# Patient Record
Sex: Female | Born: 1975 | Race: White | Hispanic: No | Marital: Married | Smoking: Current every day smoker
Health system: Southern US, Community
[De-identification: ages and names within clinical notes are randomized; demographics above are authoritative.]

## PROBLEM LIST (undated history)

## (undated) DIAGNOSIS — N63 Unspecified lump in unspecified breast: Secondary | ICD-10-CM

## (undated) DIAGNOSIS — F32A Depression, unspecified: Secondary | ICD-10-CM

## (undated) DIAGNOSIS — F329 Major depressive disorder, single episode, unspecified: Secondary | ICD-10-CM

## (undated) HISTORY — DX: Major depressive disorder, single episode, unspecified: F32.9

## (undated) HISTORY — DX: Depression, unspecified: F32.A

## (undated) HISTORY — DX: Unspecified lump in unspecified breast: N63.0

---

## 2004-05-22 HISTORY — PX: MAXILLARY CYST EXCISION: SHX2004

## 2005-08-28 ENCOUNTER — Emergency Department: Payer: Self-pay | Admitting: Emergency Medicine

## 2005-08-31 ENCOUNTER — Emergency Department: Payer: Self-pay | Admitting: Emergency Medicine

## 2006-01-17 ENCOUNTER — Ambulatory Visit: Payer: Self-pay | Admitting: *Deleted

## 2008-01-28 IMAGING — CT CT STONE STUDY
1 of 2 series · 15 of 32 positions shown, 19 images · non-contrast
Comparison: none

REASON FOR EXAM: abdominal pain       rm 4
COMMENTS:

[Series 2: stone · axial · 0.59mm/px · z∈[-536,-188]mm · 15 of 130 slices shown, 19 images]
[im 9/130  soft-tissue]
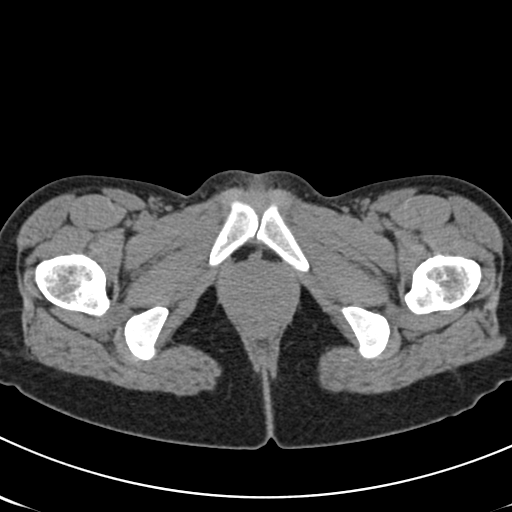
[im 9/130  bone]
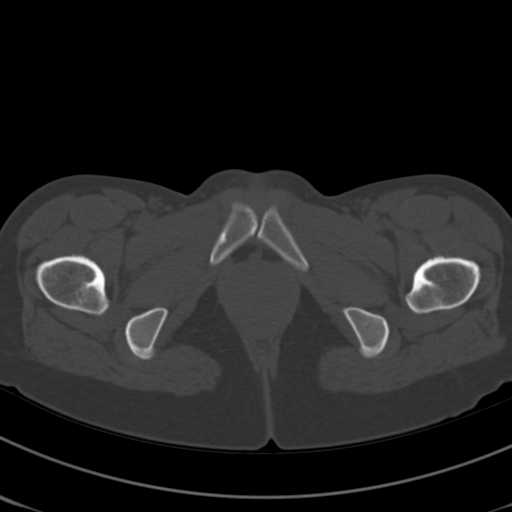
[im 18/130  soft-tissue]
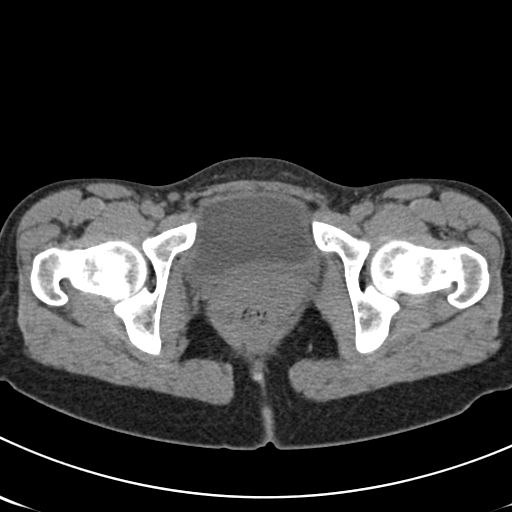
[im 27/130  soft-tissue]
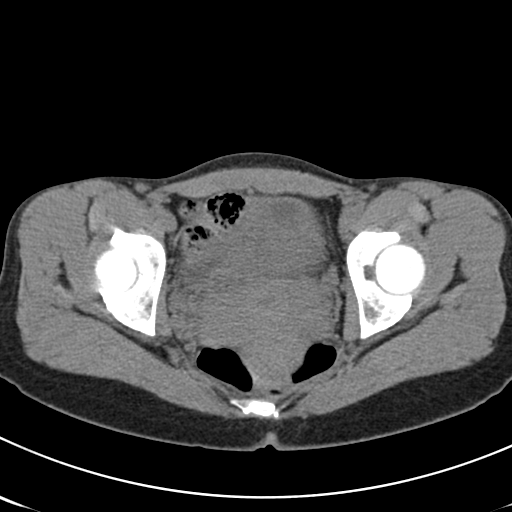
[im 36/130  soft-tissue]
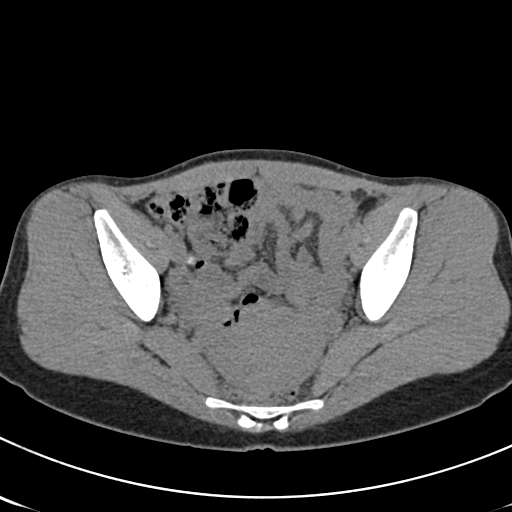
[im 45/130  soft-tissue]
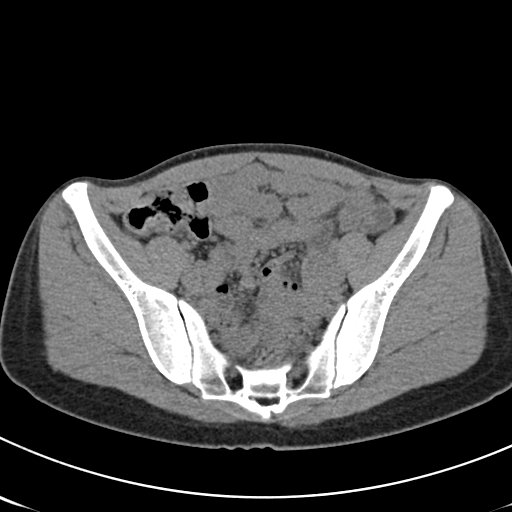
[im 54/130  soft-tissue]
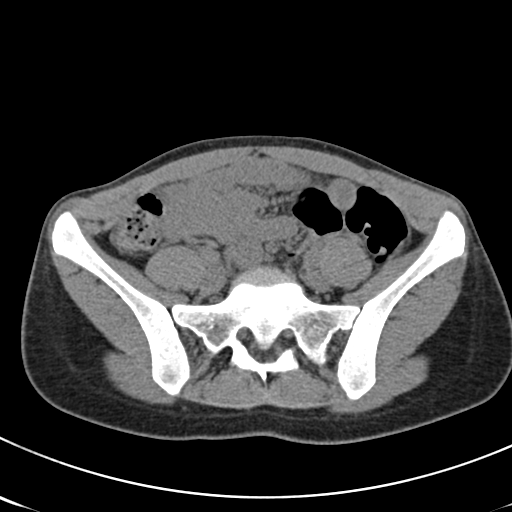
[im 67/130  soft-tissue]
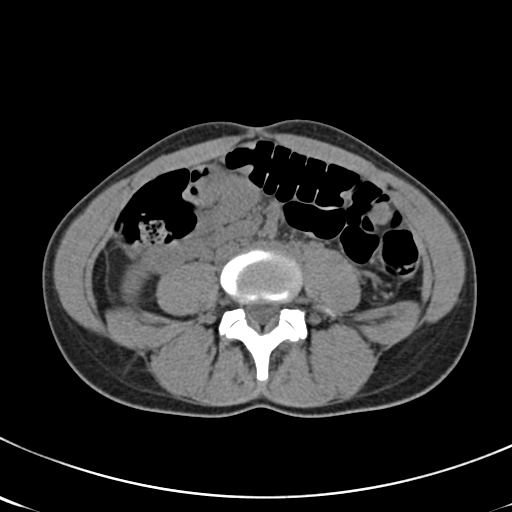
[im 76/130  soft-tissue]
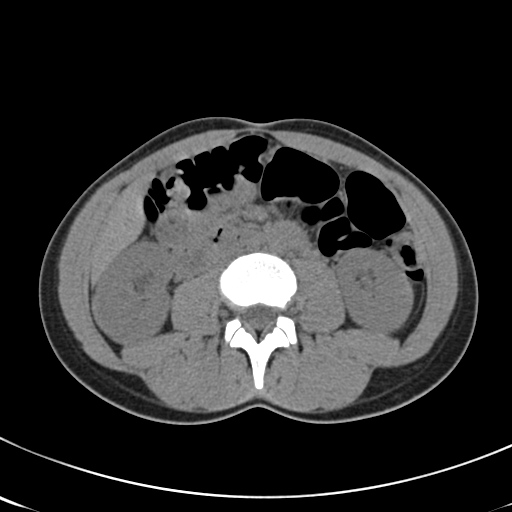
[im 85/130  soft-tissue]
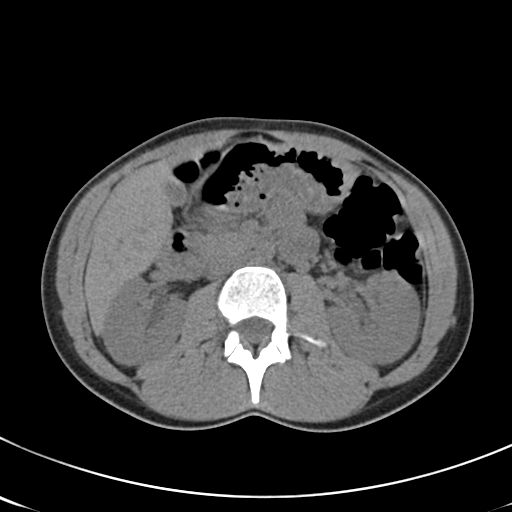
[im 85/130  bone]
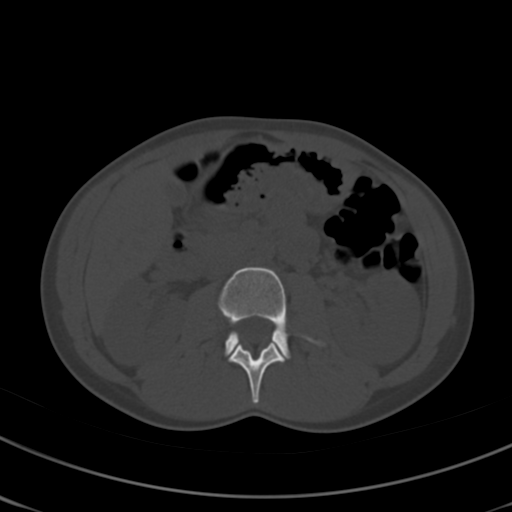
[im 94/130  soft-tissue]
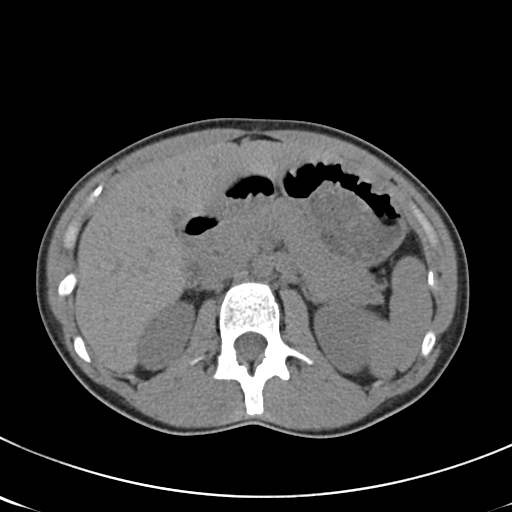
[im 103/130  soft-tissue]
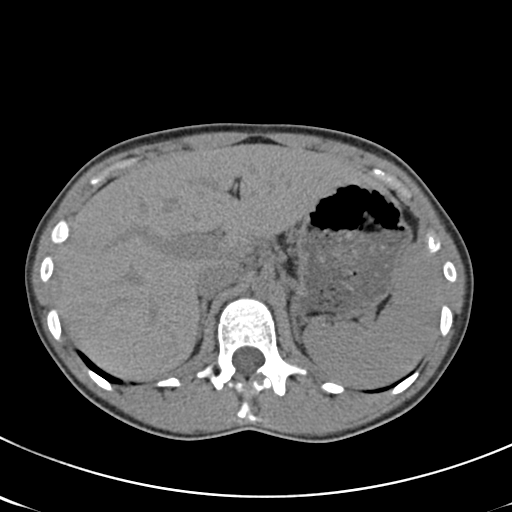
[im 112/130  soft-tissue]
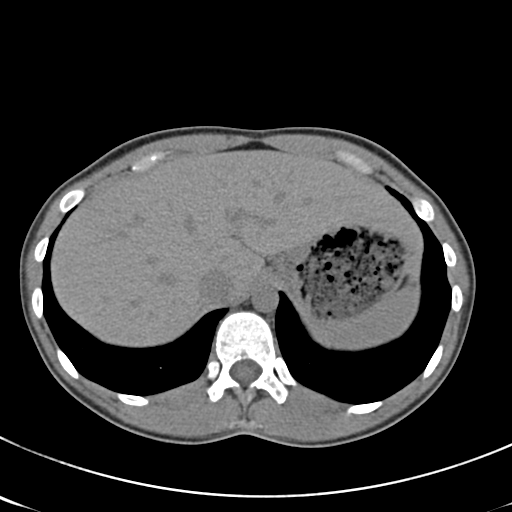
[im 112/130  lung]
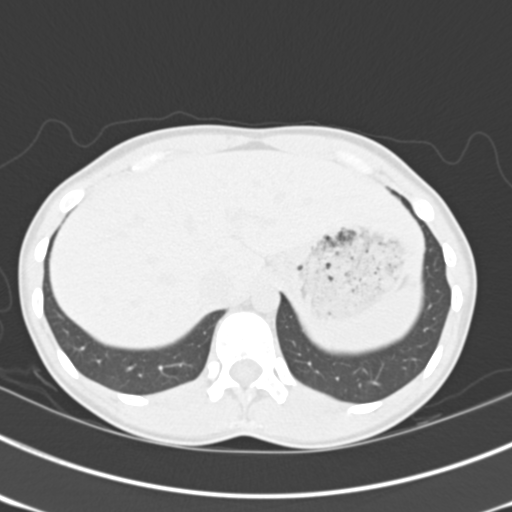
[im 116/130  lung]
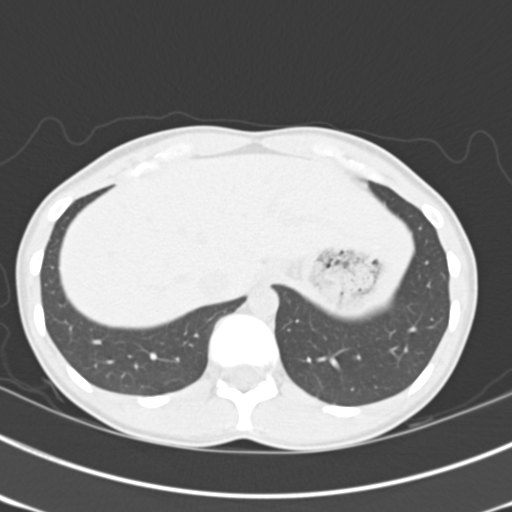
[im 121/130  soft-tissue]
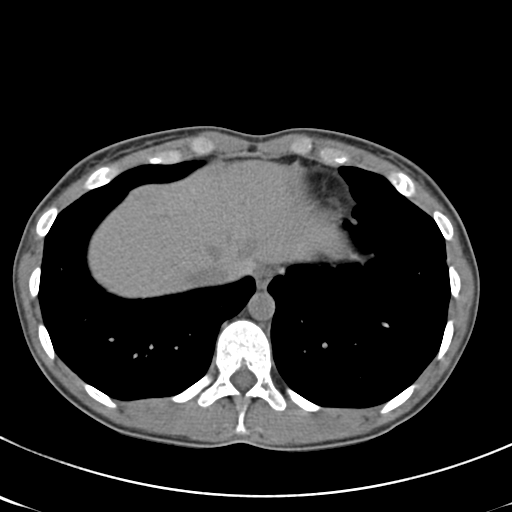
[im 121/130  lung]
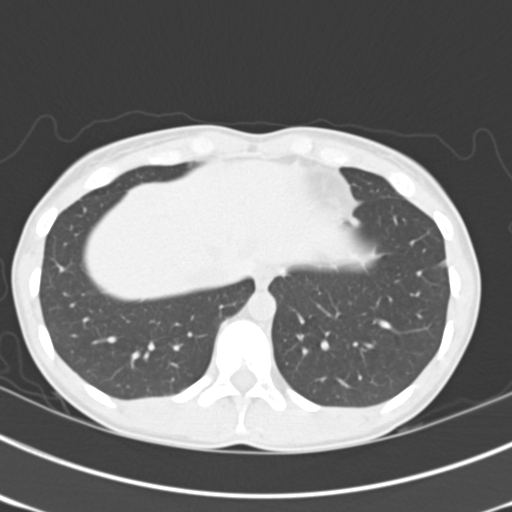
[im 125/130  lung]
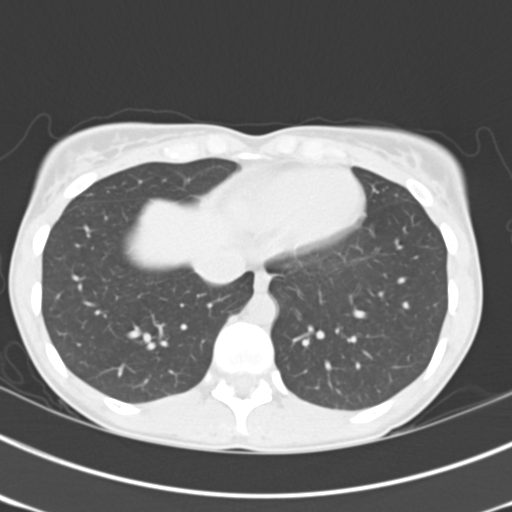

[15 of 32 positions shown; findings below may reference images not displayed]

PROCEDURE:     CT  - CT ABDOMEN /PELVIS WO (STONE)  - August 28, 2005  [DATE]

RESULT:     A preliminary report was faxed to Dr. Philomena Anand of the
Emergency Department on 08-28-05 at [DATE] Eastern Standard Time.

Evaluation of the lung bases demonstrates no focal abnormalities.
Non-contrast evaluation of the RIGHT and LEFT kidneys demonstrates no
evidence of calculi. There is mild pelvocaliectasis involving the RIGHT
kidney without evidence of hydronephrosis.  No gross evidence of masses are
identified. There is no evidence of hydroureter nor ureterolithiasis.
Evaluation of the LEFT kidney demonstrates no evidence of hydronephrosis,
masses nor calculi. There is mild prominence of the LEFT renal pelvis. The
remaining solid abdominal viscera demonstrate no radiographic abnormalities.
There is no evidence of abdominal or pelvic masses, free fluid, nor
drainable loculated fluid collections. Incidental note is made of fluid
within the endometrial canal and correlation with the patient's menstrual
cycle is recommended.
IMPRESSION: 1)No evidence of hydronephrosis nor hydroureter nor ureterolithiasis nor
nephrolithiasis.

2)Small amount of fluid within the endometrial canal.  Correlation with the
patient's menstrual cycle is recommended. There does not appear to be CT
evidence to suggest the sequela of appendicitis.

## 2008-04-02 ENCOUNTER — Emergency Department: Payer: Self-pay | Admitting: Unknown Physician Specialty

## 2008-06-18 IMAGING — CT CT PARANASAL SINUSES W/O CM
1 series · 14 of 16 positions shown, 18 images · non-contrast
Comparison: none

REASON FOR EXAM: Left facial swelling, palpable mass over left maxillary
sinus
COMMENTS:

PROCEDURE:     CT  - CT SINUSES WITHOUT CONTRAST  - January 17, 2006  [DATE]
RESULT:     CT scan of sinuses is performed without contrast.

[Series 2: sinus · axial · 0.27mm/px · z∈[+1110,+1170]mm · 14 of 16 slices shown, 18 images]
[im 2/16  brain]
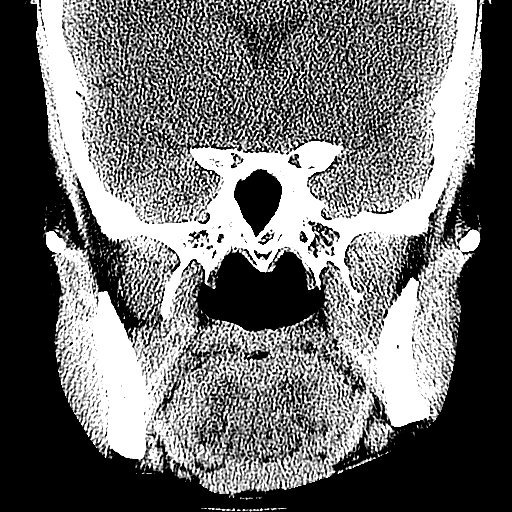
[im 2/16  bone]
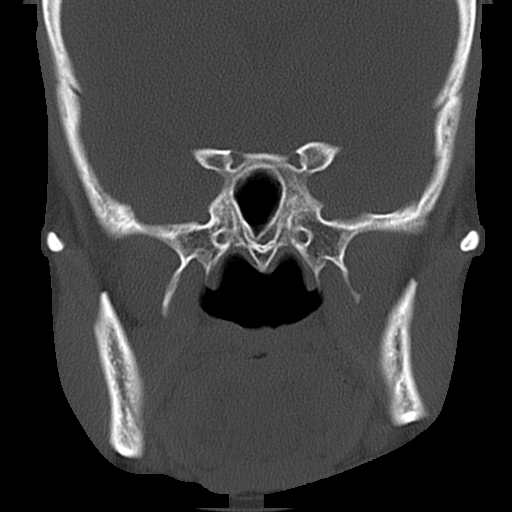
[im 3/16  bone]
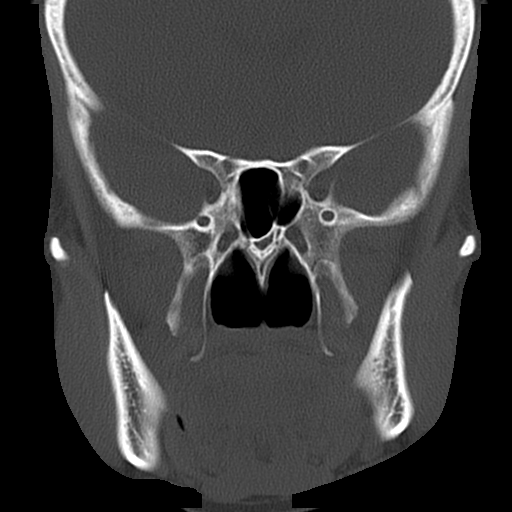
[im 4/16  bone]
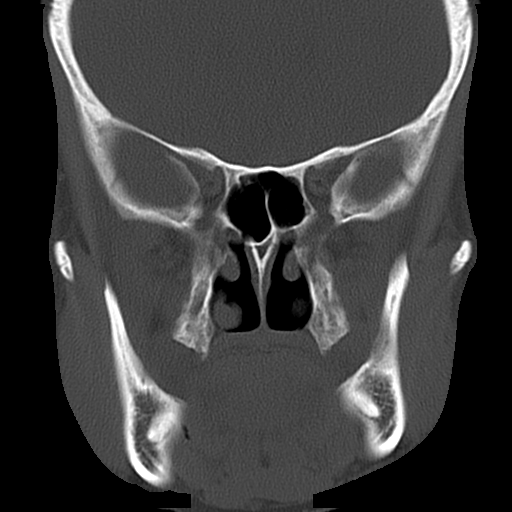
[im 5/16  bone]
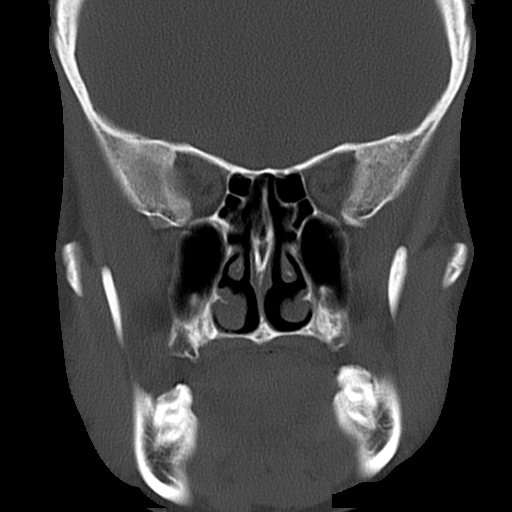
[im 6/16  brain]
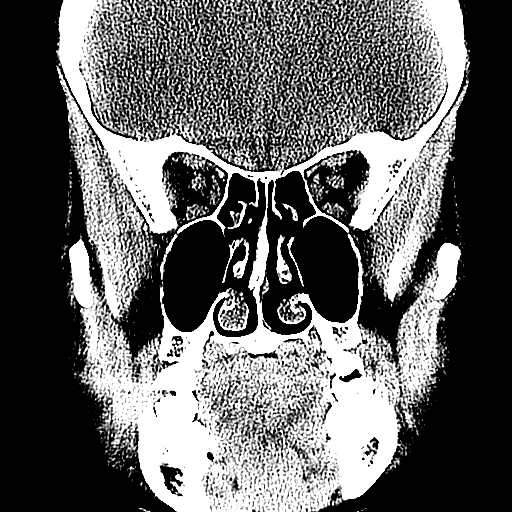
[im 6/16  bone]
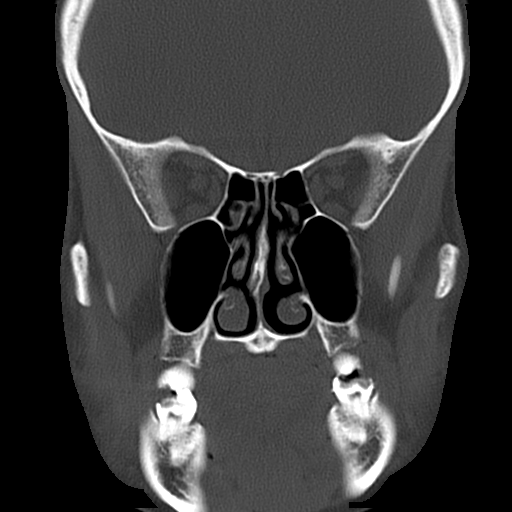
[im 7/16  bone]
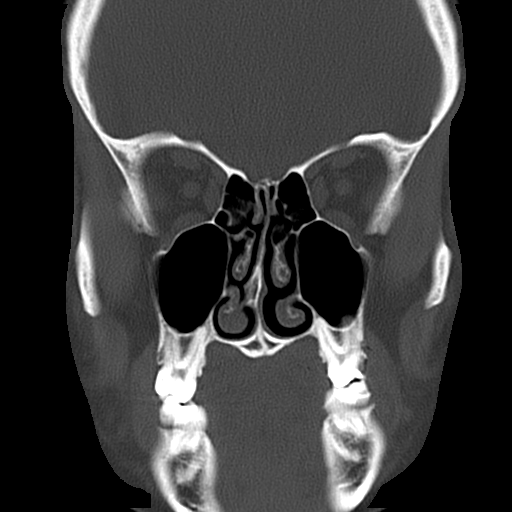
[im 8/16  bone]
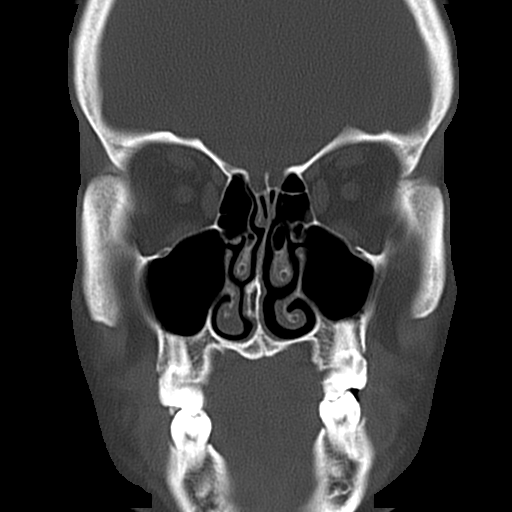
[im 9/16  bone]
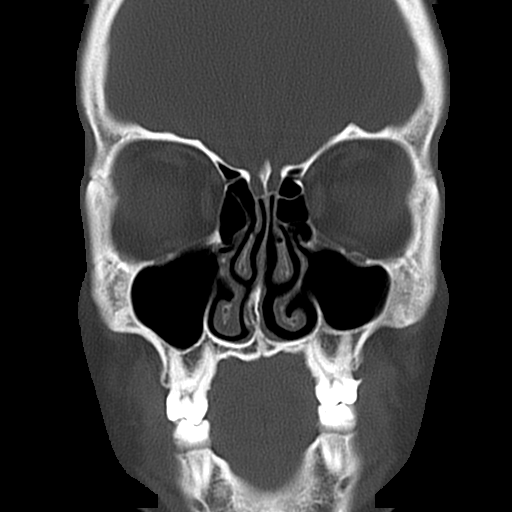
[im 10/16  brain]
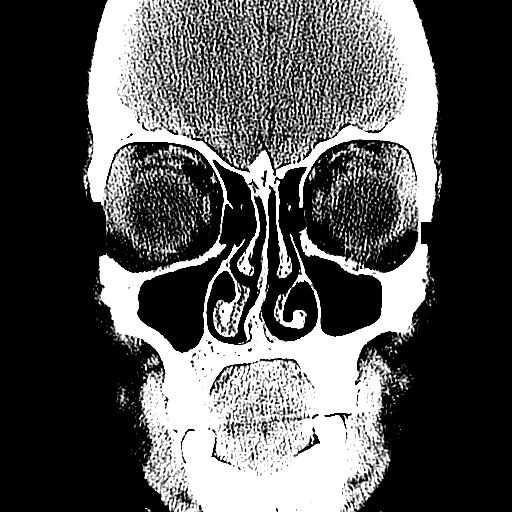
[im 10/16  bone]
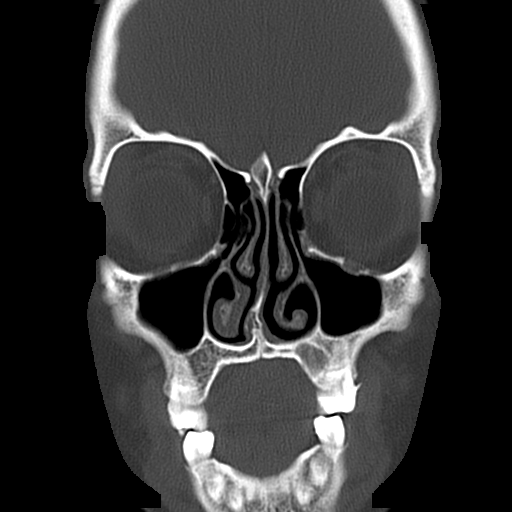
[im 11/16  bone]
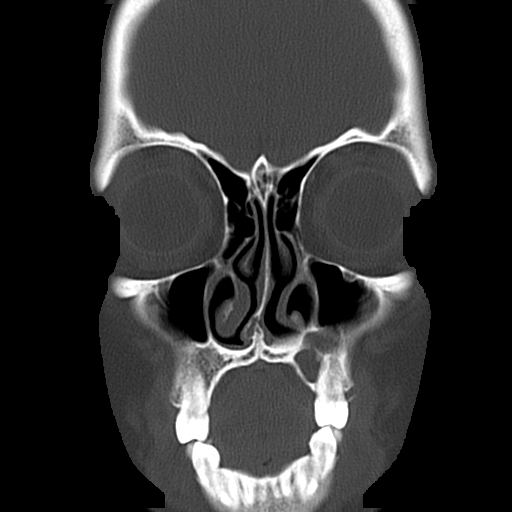
[im 12/16  bone]
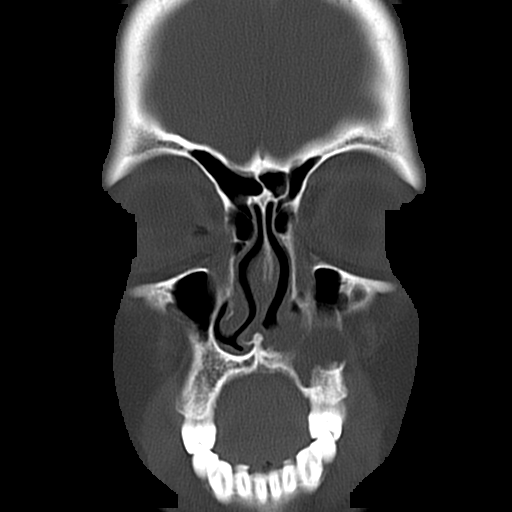
[im 13/16  bone]
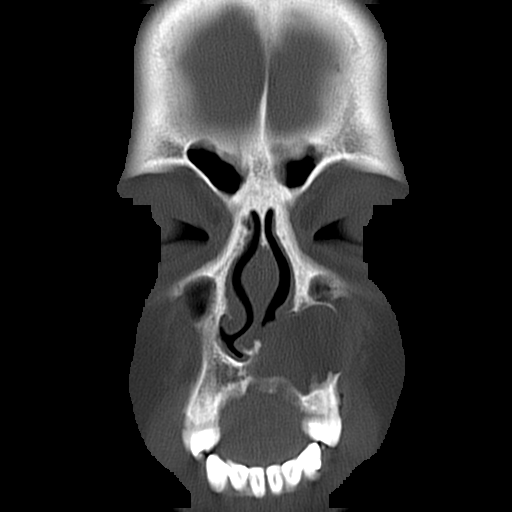
[im 14/16  brain]
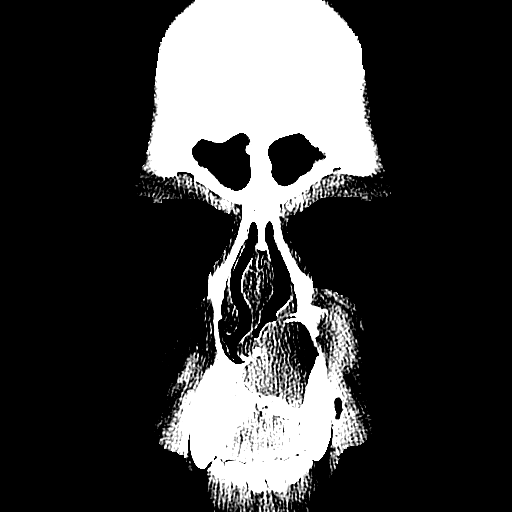
[im 14/16  bone]
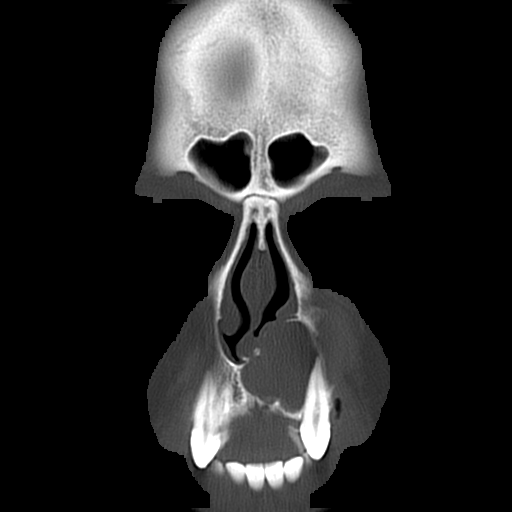
[im 15/16  bone]
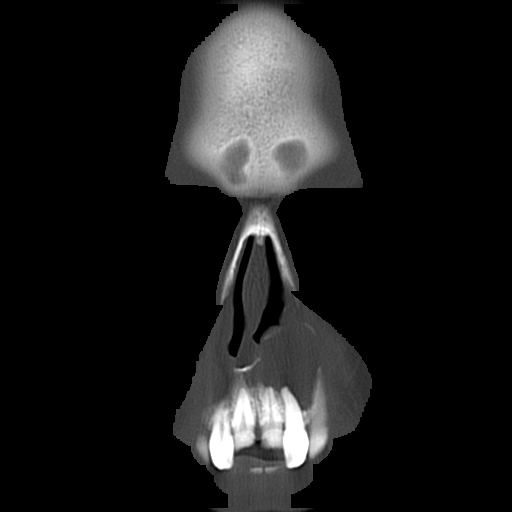

[14 of 16 positions shown; findings below may reference images not displayed]

FINDINGS: Images demonstrate an expansile lesion in the LEFT
maxilla/alveolar ridge that appears to be centered around the root of the
canine and incisor region. There appears to be significant bony thinning and
possibly even some bony destruction. This may represent a dentigerous cyst.
Neoplasm is felt to be unlikely but cannot be excluded. Oral surgical or
dental consultation is suggested. The floor of the LEFT maxillary sinus does
not appear to be involved. The anterior surface is poorly seen on these
coronal views. Otherwise, the paranasal sinuses appear to be unremarkable.
The lesion measures up to 2.85 cm in maximal diameter.
IMPRESSION: Cystic lesion as described in the LEFT maxilla/alveolar
ridge. Oral surgical consultation is recommended.

## 2008-09-01 ENCOUNTER — Ambulatory Visit: Payer: Self-pay

## 2012-05-22 DIAGNOSIS — N63 Unspecified lump in unspecified breast: Secondary | ICD-10-CM

## 2012-05-22 HISTORY — DX: Unspecified lump in unspecified breast: N63.0

## 2012-11-12 ENCOUNTER — Ambulatory Visit: Payer: Self-pay

## 2012-11-28 ENCOUNTER — Ambulatory Visit (INDEPENDENT_AMBULATORY_CARE_PROVIDER_SITE_OTHER): Payer: PRIVATE HEALTH INSURANCE | Admitting: General Surgery

## 2012-11-28 ENCOUNTER — Encounter: Payer: Self-pay | Admitting: General Surgery

## 2012-11-28 VITALS — BP 140/92 | HR 80 | Resp 12 | Ht 60.0 in | Wt 98.0 lb

## 2012-11-28 DIAGNOSIS — N63 Unspecified lump in unspecified breast: Secondary | ICD-10-CM

## 2012-11-28 NOTE — Progress Notes (Signed)
Patient ID: Cindy Vazquez, female   DOB: 08-Apr-1976, 37 y.o.   MRN: [id number]  Chief Complaint  Patient presents with  . Breast Problem    mammogram    HPI Cindy Vazquez is a 36 y.o. female.  who presents for a breast evaluation. The most recent mammogram was done on 11-13-12 and U/S done as well. Patient does perform regular self breast checks. This is her first mammogram.  She states approximately 6 weeks she noticed a lump in right breast at the 11 o'clock position. She has some tenderness and throbbing in this location. She does have a family history of breast cancer. Fullness, heaviness, and warmth. No redness. No history of trauma. The patient feels that the right breast has become more prominent, perhaps 1/2 cup size larger than baseline. The patient was last seen here in May 2010 when she was experiencing discomfort in the left breast. A small 4 mm hypoechoic area was identified during that exam. HPI  Past Medical History  Diagnosis Date  . Breast lump 2014  . Depression     Past Surgical History  Procedure Laterality Date  . Maxillary cyst excision Left 2006    Family History  Problem Relation Age of Onset  . Cancer Mother 45    breast    Social History History  Substance Use Topics  . Smoking status: Current Every Day Smoker -- 1.00 packs/day    Types: Cigarettes  . Smokeless tobacco: Not on file  . Alcohol Use: No    Allergies  Allergen Reactions  . Ciprofloxacin Swelling    No current outpatient prescriptions on file.   No current facility-administered medications for this visit.    Review of Systems Review of Systems  Constitutional: Negative.   Respiratory: Negative.   Cardiovascular: Negative.     Blood pressure 140/92, pulse 80, resp. rate 12, height 5' (1.524 m), weight 98 lb (44.453 kg), last menstrual period 11/21/2012.  Physical Exam Physical Exam  Constitutional: She is oriented to person, place, and time. She appears  well-developed and well-nourished.  Neck: No thyromegaly present.  Cardiovascular: Normal rate, regular rhythm and normal heart sounds.   No murmur heard. Pulmonary/Chest: Effort normal and breath sounds normal. Right breast exhibits no inverted nipple, no mass, no nipple discharge, no skin change and no tenderness. Left breast exhibits no inverted nipple, no mass, no nipple discharge, no skin change and no tenderness. Breasts are symmetrical.  Right breast 1/2 cup size larger than the left.    3 cm mass at 11 o'clock of the right breast.    Lymphadenopathy:    She has no cervical adenopathy.    She has no axillary adenopathy.  Neurological: She is alert and oriented to person, place, and time.  Skin: Skin is warm and dry.    Data Reviewed November 12, 2012 mammogram showed a very dense parenchymal pattern. Deep to the area patient reported concern nodularity was appreciated. Ultrasound showed a lobulated hypoechoic mass which was felt to be a nonspecific finding. BI-RAD-[id number].  Ultrasound examination of the left pressure the multilobulated area in the 11:00 position of the right breast 4 cm from the nipple. The area measured in greatest dimension 2.44 cm and multiple lobules measured 0.8 x 1.5 and 1.0 x 1.72 cm. The areas most consistent with a fibroadenoma.    Assessment    Right breast mass, probable fibroadenoma.  Options for management were reviewed: 1) observation versus 2 core biopsy to confirm a benign process.  The  patient desired to proceed with biopsy.     Plan    The area was prepped with alcohol and 10 cc of 0.5% Xylocaine with 0.25% Marcaine with 05-998 of epinephrine was utilized well-tolerated. ChloraPrep was applied. A 12-gauge S. Device was used to multiple core samples were obtained through the area. A postbiopsy clip was placed. The patient tolerated procedure well. The tissue was exceptionally dense consistent with a fibroadenoma. No bleeding was noted. The skin defect  was closed with benzoin and Steri-Strips. Instructions were provided for postoperative wound care. The patient will be contacted when biopsy results are available.       Earline Mayotte 11/29/2012, 7:25 PM

## 2012-11-28 NOTE — Patient Instructions (Addendum)

## 2012-11-29 ENCOUNTER — Encounter: Payer: Self-pay | Admitting: General Surgery

## 2012-12-02 ENCOUNTER — Telehealth: Payer: Self-pay | Admitting: General Surgery

## 2012-12-02 DIAGNOSIS — D241 Benign neoplasm of right breast: Secondary | ICD-10-CM | POA: Insufficient documentation

## 2012-12-02 LAB — PATHOLOGY

## 2012-12-02 NOTE — Telephone Encounter (Signed)
Patient advised pathology was benign. Reports moderate soreness post biopsy, but was having discomfort in the area for 6 weeks prior to the biopsy. She will return on July 17th for wound check with staff. If the discomfort persists past the end of the month, she will call and we will discuss formal excision.

## 2012-12-05 ENCOUNTER — Ambulatory Visit (INDEPENDENT_AMBULATORY_CARE_PROVIDER_SITE_OTHER): Payer: PRIVATE HEALTH INSURANCE | Admitting: *Deleted

## 2012-12-05 DIAGNOSIS — D241 Benign neoplasm of right breast: Secondary | ICD-10-CM

## 2012-12-05 DIAGNOSIS — D249 Benign neoplasm of unspecified breast: Secondary | ICD-10-CM

## 2012-12-05 NOTE — Progress Notes (Signed)
Patient here today for follow up post right  breast biopsy.  Dressing removed, steristrip in place and aware it may come off in one week.  Minimal bruising noted.  The patient is aware that a heating pad may be used for comfort as needed.  Aware of pathology. Follow up as scheduled. 

## 2012-12-13 ENCOUNTER — Encounter: Payer: Self-pay | Admitting: General Surgery

## 2013-03-20 ENCOUNTER — Ambulatory Visit: Payer: PRIVATE HEALTH INSURANCE | Admitting: General Surgery

## 2013-04-15 ENCOUNTER — Encounter: Payer: Self-pay | Admitting: *Deleted

## 2014-03-23 ENCOUNTER — Encounter: Payer: Self-pay | Admitting: General Surgery

## 2015-04-12 ENCOUNTER — Encounter: Payer: Self-pay | Admitting: Emergency Medicine

## 2015-04-12 ENCOUNTER — Emergency Department
Admission: EM | Admit: 2015-04-12 | Discharge: 2015-04-12 | Disposition: A | Discharge status: Home / Self Care | Payer: Self-pay | Attending: Emergency Medicine | Admitting: Emergency Medicine

## 2015-04-12 DIAGNOSIS — N938 Other specified abnormal uterine and vaginal bleeding: Secondary | ICD-10-CM | POA: Insufficient documentation

## 2015-04-12 DIAGNOSIS — F17210 Nicotine dependence, cigarettes, uncomplicated: Secondary | ICD-10-CM | POA: Insufficient documentation

## 2015-04-12 DIAGNOSIS — Z3202 Encounter for pregnancy test, result negative: Secondary | ICD-10-CM | POA: Insufficient documentation

## 2015-04-12 DIAGNOSIS — N644 Mastodynia: Secondary | ICD-10-CM | POA: Insufficient documentation

## 2015-04-12 DIAGNOSIS — F419 Anxiety disorder, unspecified: Secondary | ICD-10-CM | POA: Insufficient documentation

## 2015-04-12 LAB — CBC
HCT: 41.3 % (ref 35.0–47.0)
Hemoglobin: 13.3 g/dL (ref 12.0–16.0)
MCH: 29.3 pg (ref 26.0–34.0)
MCHC: 32.2 g/dL (ref 32.0–36.0)
MCV: 90.9 fL (ref 80.0–100.0)
PLATELETS: 302 10*3/uL (ref 150–440)
RBC: 4.54 MIL/uL (ref 3.80–5.20)
RDW: 12.7 % (ref 11.5–14.5)
WBC: 6.6 10*3/uL (ref 3.6–11.0)

## 2015-04-12 LAB — HCG, QUANTITATIVE, PREGNANCY: hCG, Beta Chain, Quant, S: 1 m[IU]/mL (ref <5)

## 2015-04-12 LAB — POCT PREGNANCY, URINE: Preg Test, Ur: NEGATIVE

## 2015-04-12 NOTE — Discharge Instructions (Signed)

## 2015-04-12 NOTE — ED Provider Notes (Signed)
[facility] Emergency Department Provider Note  ____________________________________________  Time seen: 10 AM  I have reviewed the triage vital signs and the nursing notes.   HISTORY  Chief Complaint Vaginal Bleeding    HPI Cindy Vazquez is a 39 y.o. female who presents with complaints of vaginal bleeding. She reports it started 2 days ago. Typically she has a regular period around the 20th of every month, she had a normal period in October and reports that she did have a regular period last week although it was earlier than expected. She developed heavy vaginal bleeding on Saturday she denies dizziness but complains of feeling drained. She does have some breast tenderness and some pelvic cramping. She does not believe that she is pregnant but she is not on birth control     Past Medical History  Diagnosis Date  . Breast lump 2014  . Depression     Patient Active Problem List   Diagnosis Date Noted  . 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 Breast lump     Past Surgical History  Procedure Laterality Date  . Maxillary cyst excision Left 2006    No current outpatient prescriptions on file.  Allergies Ciprofloxacin  Family History  Problem Relation Age of Onset  . Cancer Mother 65    breast    Social History Social History  Substance Use Topics  . Smoking status: Current Every Day Smoker -- 1.00 packs/day    Types: Cigarettes  . Smokeless tobacco: None  . Alcohol Use: No    Review of Systems  Constitutional: Negative for fever. Eyes: Negative for visual changes. ENT: Negative for sore throat Cardiovascular: Negative for chest pain. Respiratory: Negative for shortness of breath. Gastrointestinal: Negative for vomiting and diarrhea. Genitourinary: Negative for dysuria. Vaginal bleeding Musculoskeletal: Negative for back pain. Skin: Negative for rash. Neurological: Negative for headaches or focal weakness Psychiatric: Mild  anxiety    ____________________________________________   PHYSICAL EXAM:  VITAL SIGNS: ED Triage Vitals  Enc Vitals Group     BP 04/12/15 0910 130/75 mmHg     Pulse Rate 04/12/15 0910 85     Resp 04/12/15 0910 20     Temp 04/12/15 0910 98.4 F (36.9 C)     Temp Source 04/12/15 0910 Oral     SpO2 04/12/15 0910 100 %     Weight 04/12/15 0910 100 lb (45.36 kg)     Height 04/12/15 0910 5' (1.524 m)     Head Cir --      Peak Flow --      Pain Score 04/12/15 0910 7     Pain Loc --      Pain Edu? --      Excl. in [place]? --      Constitutional: Alert and oriented. Well appearing and in no distress. Eyes: Conjunctivae are normal.  ENT   Head: Normocephalic and atraumatic.   Mouth/Throat: Mucous membranes are moist. Cardiovascular: Normal rate, regular rhythm. Normal and symmetric distal pulses are present in all extremities. Respiratory: Normal respiratory effort without tachypnea nor retractions.  Gastrointestinal: Soft and non-tender in all quadrants. No distention. There is no CVA tenderness. Genitourinary: deferred Musculoskeletal: Nontender with normal range of motion in all extremities. No lower extremity tenderness nor edema. Neurologic:  Normal speech and language. No gross focal neurologic deficits are appreciated. Skin:  Skin is warm, dry and intact. No rash noted. Psychiatric: Mood and affect are normal. Patient exhibits appropriate insight and judgment.  ____________________________________________  LABS (pertinent positives/negatives)  Labs Reviewed  CBC  HCG, QUANTITATIVE, PREGNANCY    ____________________________________________   EKG  None  ____________________________________________    RADIOLOGY I have personally reviewed any xrays that were ordered on this patient: None  ____________________________________________   PROCEDURES  Procedure(s) performed: none  Critical Care performed:  none  ____________________________________________   INITIAL IMPRESSION / ASSESSMENT AND PLAN / ED COURSE  Pertinent labs & imaging results that were available during my care of the patient were reviewed by me and considered in my medical decision making (see chart for details).  Patient presents with abnormal vaginal bleeding. She does not believe she is pregnant but she is not sure. We will start with pregnancy test  Patient's urine pregnancy test is negative, CBC is normal. we agreed that ultrasound at this time and management, she prefers to follow-up with gynecology later this week  ____________________________________________   FINAL CLINICAL IMPRESSION(S) / ED DIAGNOSES  Final diagnoses:  DUB (dysfunctional uterine bleeding)     Lavonia Drafts, MD 04/12/15 1416

## 2015-04-12 NOTE — ED Notes (Signed)
Kinner at bedside, pt states she has been cramping and having breast tenderness. Pt appears in no distress at this time.

## 2015-04-12 NOTE — ED Notes (Signed)
Pt to ed with c/o vaginal bleeding that started on Sat.  Pt states she had a regular period last week, although it was early.  Pt reports heavy vaginal bleeding again on Sat.  Pt reports she feels weak now.

## 2015-04-14 IMAGING — US ULTRASOUND RIGHT BREAST
1 series · 14 of 25 positions shown · non-contrast
Comparison: none

REASON FOR EXAM: RT BRST ENLARGEMENT AND PAIN BASELINE
COMMENTS:

[Series 1: ultrasound right breast · 0.08mm/px · 14 of 31 slices shown]
[im 1/31]
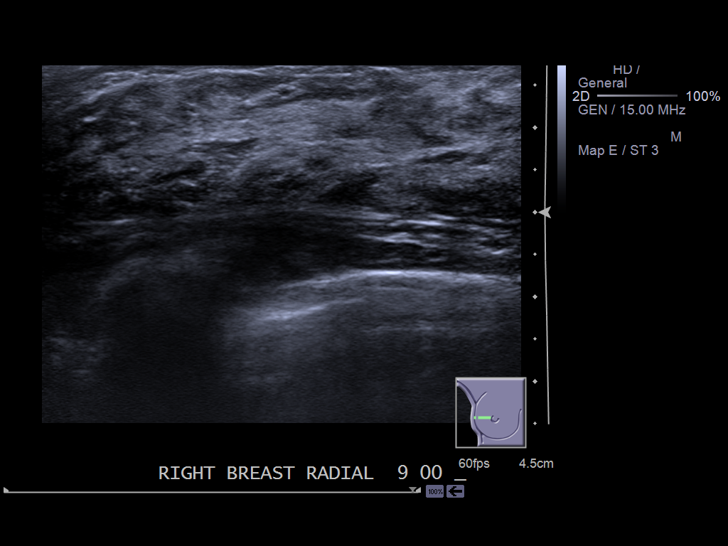
[im 3/31]
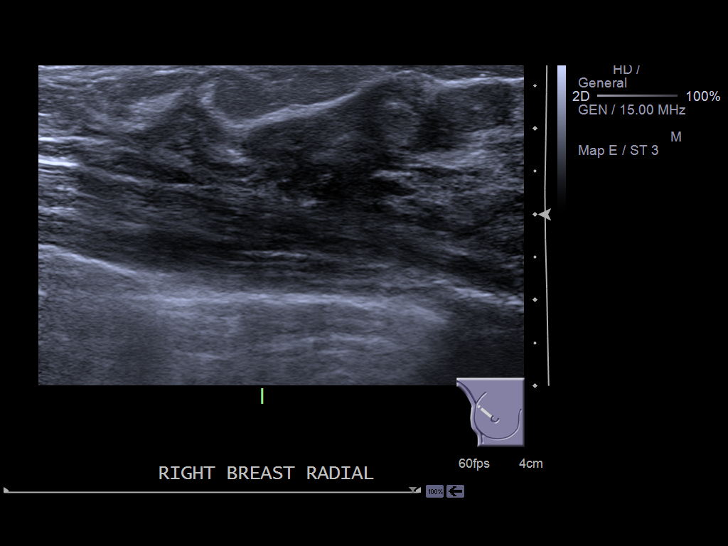
[im 6/31]
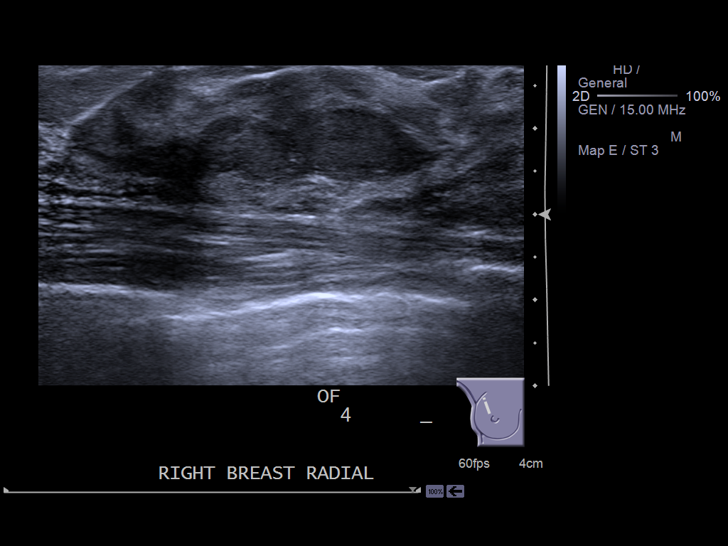
[im 8/31]
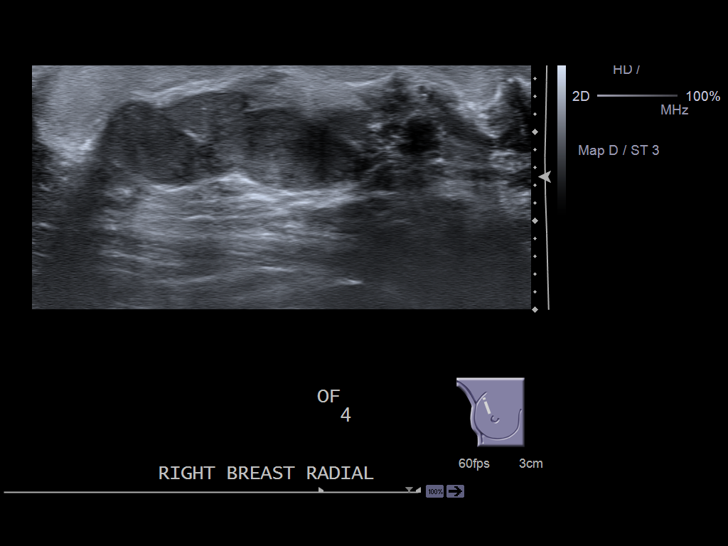
[im 11/31]
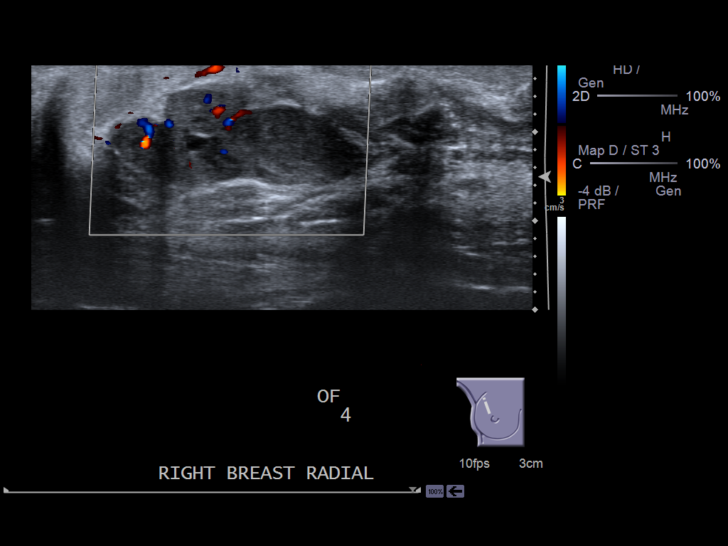
[im 12/31]
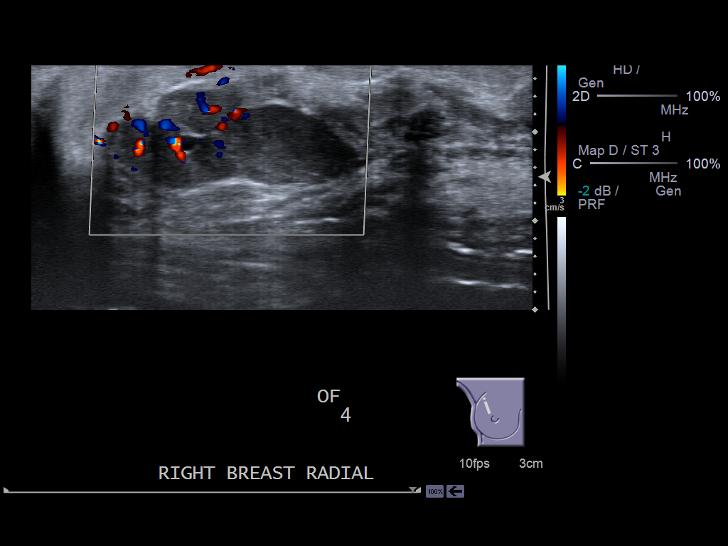
[im 14/31]
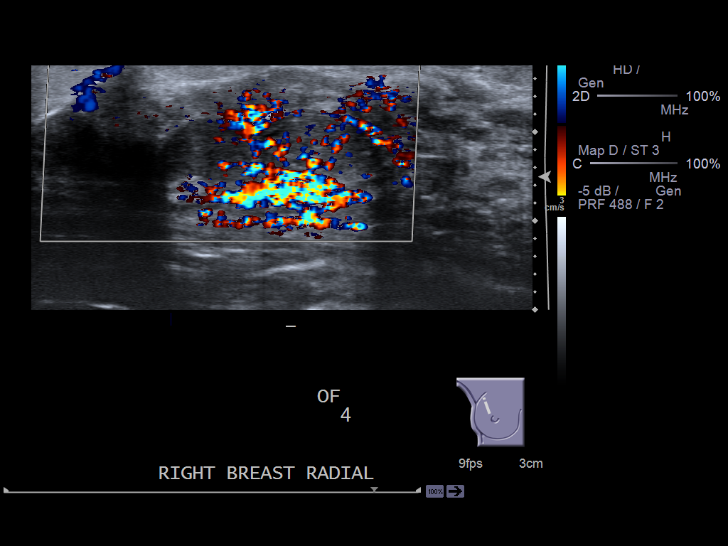
[im 17/31]
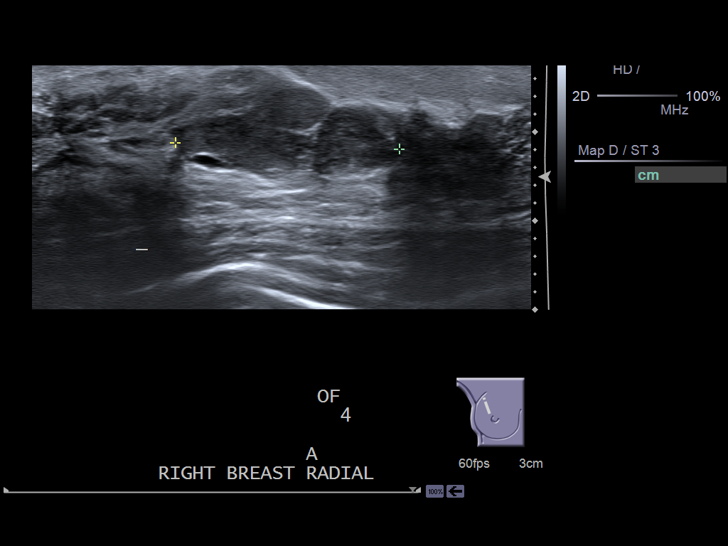
[im 19/31]
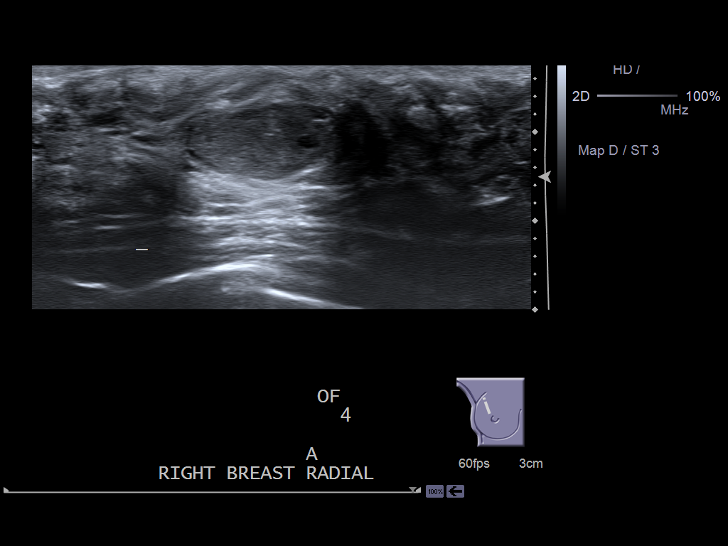
[im 21/31]
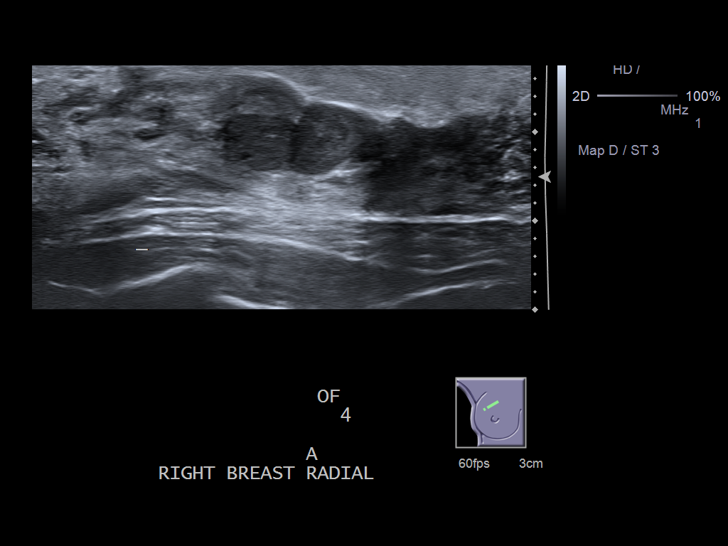
[im 23/31]
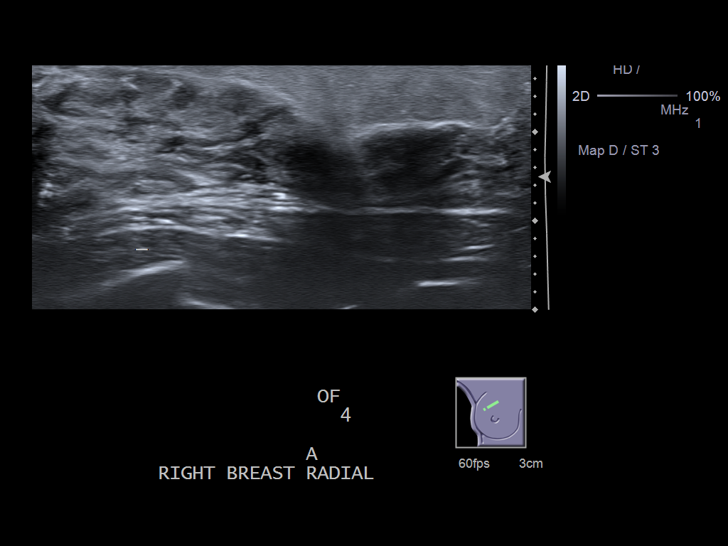
[im 26/31]
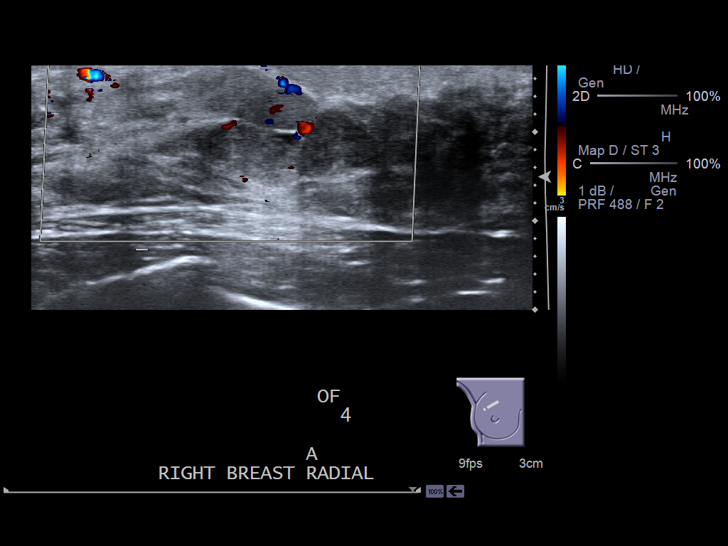
[im 28/31]
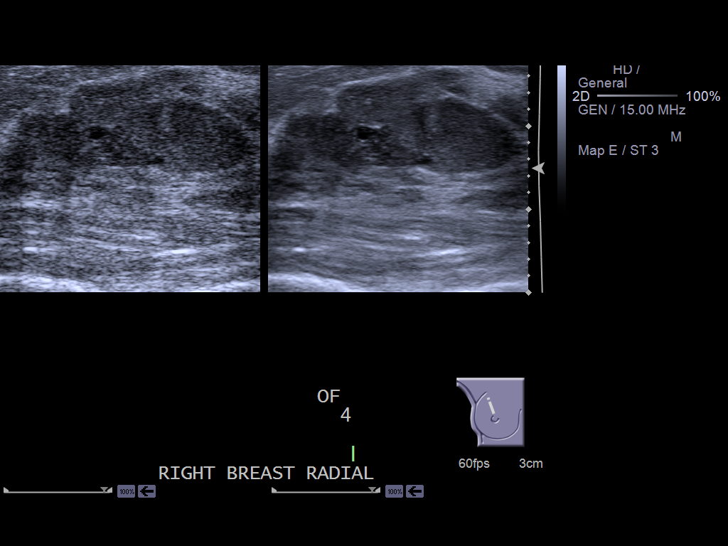
[im 31/31]
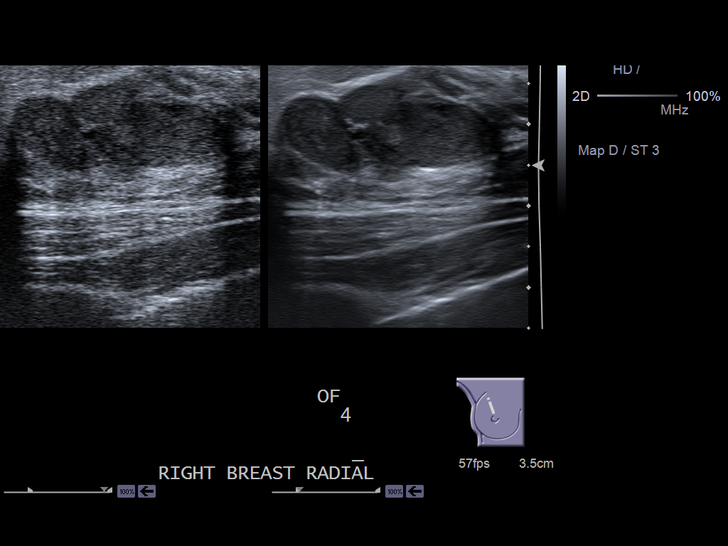

[14 of 25 positions shown; findings below may reference images not displayed]

PROCEDURE:     US  - US RT BREAST ([REDACTED])  - November 12, 2012  [DATE]

RESULT:     The patient underwent ultrasound directed to the upper outer
aspect of the right breast. At the [DATE] position there is a 3.1 to 0.9 x
2.5 cm diameter lobulated hypoechoic structure with some internal
vascularity. It demonstrates enhanced through-transmission. Elsewhere the
imaged parenchyma is normal. No abnormal shadowing is demonstrated.
IMPRESSION: Deep to the area of palpable nodularity and discomfort
there is a lobulated focus of decreased echogenicity with enhanced
through-transmission and some increased vascularity. The pattern is not
suspicious for malignancy. Continued clinical followup is needed. Please see
the dictation of the diagnostic mammogram of today's date for final
recommendations and BI-RADS classification.

A NEGATIVE MAMMOGRAM REPORT DOES NOT PRECLUDE BIOPSY OR OTHER EVALUATION OF
A CLINICALLY PALPABLE OR OTHERWISE SUSPICIOUS MASS OR LESION. BREAST CANCER
MAY NOT BE DETECTED BY MAMMOGRAPHY IN UP TO 10% OF CASES.

Dictation site:1

## 2016-07-28 ENCOUNTER — Ambulatory Visit: Payer: PRIVATE HEALTH INSURANCE | Admitting: Obstetrics & Gynecology

## 2016-09-11 ENCOUNTER — Encounter: Payer: Self-pay | Admitting: Obstetrics & Gynecology

## 2016-09-11 ENCOUNTER — Ambulatory Visit (INDEPENDENT_AMBULATORY_CARE_PROVIDER_SITE_OTHER): Payer: BC Managed Care – PPO | Admitting: Obstetrics & Gynecology

## 2016-09-11 VITALS — BP 118/80 | HR 74 | Ht 60.0 in | Wt 99.0 lb

## 2016-09-11 DIAGNOSIS — R87610 Atypical squamous cells of undetermined significance on cytologic smear of cervix (ASC-US): Secondary | ICD-10-CM | POA: Diagnosis not present

## 2016-09-11 HISTORY — DX: Atypical squamous cells of undetermined significance on cytologic smear of cervix (ASC-US): R87.610

## 2016-09-11 NOTE — Progress Notes (Signed)
Referring Provider:  [facility]  HPI:  Cindy Vazquez is a 41 y.o.  G2P2  who presents today for evaluation and management of abnormal cervical cytology.  Also, c/o reg periods but sometimes 2 per month.  No hormones (BC- film, condom).  Not overly bothersome.  Dysplasia History:  ASCUS in Nov, delay in getting appt here.  No prior h/o abn PAP.  ROS:  Pertinent items noted in HPI and remainder of comprehensive ROS otherwise negative.  OB History  Gravida Para Term Preterm AB Living  2 2       2   SAB TAB Ectopic Multiple Live Births               # Outcome Date GA Lbr Len/2nd Weight Sex Delivery Anes PTL Lv  2 Para           1 Para             Obstetric Comments  1st Menstrual Cycle:  16  1st Pregnancy:  22    Past Medical History:  Diagnosis Date  . Breast lump 2014  . Depression     Past Surgical History:  Procedure Laterality Date  . MAXILLARY CYST EXCISION Left 2006    SOCIAL HISTORY: History  Alcohol Use No   History  Drug Use No     Family History  Problem Relation Age of Onset  . Cancer Mother 48    breast    ALLERGIES:  Ciprofloxacin  No current outpatient prescriptions on file prior to visit.   No current facility-administered medications on file prior to visit.     Physical Exam: -Vitals:  BP 118/80   Pulse 74   Ht 5' (1.524 m)   Wt 99 lb (44.9 kg)   LMP 08/28/2016   BMI 19.33 kg/m  GEN: WD, WN, NAD.  A+ O x 3, good mood and affect. ABD:  NT, ND.  Soft, no masses.  No hernias noted.   Pelvic:   Vulva: Normal appearance.  No lesions.  Vagina: No lesions or abnormalities noted.  Support: Normal pelvic support.  Urethra No masses tenderness or scarring.  Meatus Normal size without lesions or prolapse.  Cervix: See below.  Anus: Normal exam.  No lesions.  Perineum: Normal exam.  No lesions.        Bimanual   Uterus: Normal size.  Non-tender.  Mobile.  AV.  Adnexae: No masses.  Non-tender to palpation.  Cul-de-sac: Negative  for abnormality.   PROCEDURE: 1.  Urine Pregnancy Test:  not done 2.  Colposcopy performed with 4% acetic acid after verbal consent obtained                                         -Aceto-white Lesions Location(s): 6 o'clock.              -Biopsy performed at 6 and 12 o'clock               -ECC indicated and performed: Yes.       -Biopsy sites made hemostatic with pressure, AgNO3, and/or Monsel's solution   -Satisfactory colposcopy: Yes.      -Evidence of Invasive cervical CA :  NO  ASSESSMENT:  Cindy Vazquez is a 41 y.o. G2P2 here for  1. Atypical squamous cell changes of undetermined significance (ASCUS) on cervical cytology with negative high risk human papilloma virus (HPV) test  result   .  PLAN: 1.  I discussed the grading system of pap smears and HPV high risk viral types.  We will discuss and base management after colpo results return. 2. Follow up PAP 6 months, vs intervention if high grade dysplasia identified 3. Treatment of persistantly abnormal PAP smears and cervical dysplasia, even mild, is discussed w pt today in detail, as well as the pros and cons of Cryo and LEEP procedures. Will consider and discuss after results. 4. Monitor cycles; [place] and discussion of AUB tx if worsens or bothersome to pt.     Barnett Applebaum, MD, [facility], [facility] 09/11/2016  8:41 AM

## 2016-09-13 LAB — PATHOLOGY

## 2016-09-13 NOTE — Progress Notes (Signed)
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

## 2017-03-13 ENCOUNTER — Ambulatory Visit: Payer: BC Managed Care – PPO | Admitting: Obstetrics & Gynecology

## 2020-02-05 ENCOUNTER — Other Ambulatory Visit: Payer: Self-pay

## 2020-02-05 ENCOUNTER — Other Ambulatory Visit: Payer: Self-pay | Admitting: *Deleted

## 2020-02-05 DIAGNOSIS — Z20822 Contact with and (suspected) exposure to covid-19: Secondary | ICD-10-CM

## 2020-02-07 LAB — SARS-COV-2, NAA 2 DAY TAT

## 2020-02-07 LAB — NOVEL CORONAVIRUS, NAA: SARS-CoV-2, NAA: NOT DETECTED

## 2020-02-07 LAB — SPECIMEN STATUS REPORT

## 2020-06-01 ENCOUNTER — Encounter: Payer: Self-pay | Admitting: Obstetrics and Gynecology

## 2021-02-09 ENCOUNTER — Ambulatory Visit (INDEPENDENT_AMBULATORY_CARE_PROVIDER_SITE_OTHER): Payer: 59 | Admitting: Obstetrics

## 2021-02-09 ENCOUNTER — Encounter: Payer: Self-pay | Admitting: Obstetrics

## 2021-02-09 ENCOUNTER — Other Ambulatory Visit: Payer: Self-pay

## 2021-02-09 ENCOUNTER — Other Ambulatory Visit (HOSPITAL_COMMUNITY)
Admission: RE | Admit: 2021-02-09 | Discharge: 2021-02-09 | Disposition: A | Discharge status: Home / Self Care | Payer: Self-pay | Source: Ambulatory Visit | Attending: Obstetrics | Admitting: Obstetrics

## 2021-02-09 VITALS — BP 122/76 | Ht 60.0 in | Wt 113.0 lb

## 2021-02-09 DIAGNOSIS — Z8742 Personal history of other diseases of the female genital tract: Secondary | ICD-10-CM | POA: Insufficient documentation

## 2021-02-09 DIAGNOSIS — N923 Ovulation bleeding: Secondary | ICD-10-CM | POA: Diagnosis not present

## 2021-02-09 LAB — POCT URINE PREGNANCY: Preg Test, Ur: NEGATIVE

## 2021-02-09 NOTE — Progress Notes (Signed)
Obstetrics & Gynecology Office Visit   Chief Complaint:  Chief Complaint  Patient presents with   Vaginal Bleeding    Menses started x 2 weeks ago very heavy with clots and painful. RM 5    History of Present Illness: Cindy Vazquez is a 45 year old patient whose last visit was in 2018. She was seen for an annual exam; had an abnormal pap smear, and then colposcopy. Her labs revealed CIN 1 but she did not follow up on the findings. Today she complains of vaginal bleeding that has occurred in the middle of her normal cycle. It started 2 weeks ago, became very heavy, and she has passed clots. The last few days, the bleeding has lightened up some what but still continues.  She also reports having very tender breasts, that since the bleeding started, have become less so. She uses spermicidal film for birth control, and this is due to having significant mood changes when using hormonal birth control.    Review of Systems:  Review of Systems  Constitutional: Negative.   HENT: Negative.    Eyes: Negative.   Respiratory: Negative.    Cardiovascular: Negative.   Gastrointestinal: Negative.   Musculoskeletal: Negative.   Skin: Negative.   Neurological: Negative.   Endo/Heme/Allergies: Negative.   Psychiatric/Behavioral: Negative.      Past Medical History:  Past Medical History:  Diagnosis Date   Breast lump 2014   Depression     Past Surgical History:  Past Surgical History:  Procedure Laterality Date   MAXILLARY CYST EXCISION Left 2006    Gynecologic History: Patient's last menstrual period was 01/26/2021.  Obstetric History: G2P2  Family History:  Family History  Problem Relation Age of Onset   Cancer Mother 79       breast    Social History:  Social History   Socioeconomic History   Marital status: Married    Spouse name: Not on file   Number of children: Not on file   Years of education: Not on file   Highest education level: Not on file  Occupational History    Not on file  Tobacco Use   Smoking status: Every Day    Packs/day: 1.00    Types: Cigarettes   Smokeless tobacco: Never  Vaping Use   Vaping Use: Never used  Substance and Sexual Activity   Alcohol use: No   Drug use: No   Sexual activity: Yes    Birth control/protection: Spermicide  Other Topics Concern   Not on file  Social History Narrative   Not on file   Social Determinants of Health   Financial Resource Strain: Not on file  Food Insecurity: Not on file  Transportation Needs: Not on file  Physical Activity: Not on file  Stress: Not on file  Social Connections: Not on file  Intimate Partner Violence: Not on file    Allergies:  Allergies  Allergen Reactions   Ciprofloxacin Swelling    facial    Medications: Prior to Admission medications   Not on File    Physical Exam Vitals:  Vitals:   02/09/21 1409  BP: 122/76   Patient's last menstrual period was 01/26/2021.  Physical Exam Constitutional:      Appearance: Normal appearance. She is normal weight.  HENT:     Head: Normocephalic and atraumatic.  Cardiovascular:     Rate and Rhythm: Normal rate and regular rhythm.  Pulmonary:     Effort: Pulmonary effort is normal.  Breath sounds: Normal breath sounds.  Abdominal:     General: Bowel sounds are normal.     Palpations: Abdomen is soft.  Genitourinary:    General: Normal vulva.     Comments: No rashes or external lesions. Scant vaginal bleeding noted with spec exam. Her uterus anteverted, non enlarged, mobile and non tender. No adnexal pain noted Musculoskeletal:        General: Normal range of motion.     Cervical back: Normal range of motion and neck supple.  Skin:    General: Skin is warm and dry.  Neurological:     General: No focal deficit present.     Mental Status: She is alert and oriented to person, place, and time.  Psychiatric:        Mood and Affect: Mood normal.        Behavior: Behavior normal.     Assessment: 45 y.o. G2P2  with abnormal vaginal bleeding of unknown cause Urine pregnancy test negative ? SAB Abnormal pap smear without follow up.  Plan: Problem List Items Addressed This Visit   None Visit Diagnoses     Vaginal bleeding between periods    -  Primary   Relevant Orders   Beta hCG quant (ref lab)   Beta hCG quant (ref lab)   POCT urine pregnancy (Completed)   Vaginal bleeding       Relevant Orders   Beta hCG quant (ref lab)   Beta hCG quant (ref lab)   POCT urine pregnancy (Completed)   History of abnormal cervical Papanicolaou smear       Relevant Orders   Cytology - PAP      Pa smear retrieved today. Will draw a quant today, and consider repeat on Friday. We discussed the possibility that this represents an SAB. In light of her negative urine HCG, will order quants for today and consider repeat in two days time. Pelvic ultrasound ordered, and will follow up based on the report. Discussed the import of following her abnormal pap smear and having an annual exam soon.  Imagene Riches, CNM  02/09/2021 6:05 PM

## 2021-02-10 LAB — BETA HCG QUANT (REF LAB): hCG Quant: <1 m[IU]/mL

## 2021-02-10 NOTE — Progress Notes (Signed)
Called the patient and relayed the test results. She will cancel her repeat draw tomorrow. She has a pelvic ultrasound ordered and we will f/u with this in determining whether she needs additional care. Imagene Riches, CNM  02/10/2021 5:30 PM

## 2021-02-11 ENCOUNTER — Other Ambulatory Visit: Payer: 59

## 2021-02-11 LAB — CYTOLOGY - PAP
Comment: NEGATIVE
Diagnosis: NEGATIVE
High risk HPV: NEGATIVE

## 2021-02-14 ENCOUNTER — Encounter: Payer: Self-pay | Admitting: Obstetrics

## 2022-07-18 ENCOUNTER — Ambulatory Visit: Payer: Self-pay | Admitting: Physician Assistant

## 2022-07-18 NOTE — Progress Notes (Deleted)
I,Sha'taria Rondell Pardon,acting as a Education administrator for [employer], PA-C.,have documented all relevant documentation on the behalf of Mikey Kirschner, PA-C,as directed by  Mikey Kirschner, PA-C while in the presence of Mikey Kirschner, PA-C.  New patient visit   Patient: Cindy Vazquez   DOB: [birth year]   47 y.o. Female  MRN: [id number] Visit Date: 07/18/2022  Today's healthcare provider: Mikey Kirschner, PA-C   No chief complaint on file.  Subjective    Cindy Vazquez is a 47 y.o. female who presents today as a new patient to establish care.  HPI  ***  Past Medical History:  Diagnosis Date   Breast lump 2014   Depression    Past Surgical History:  Procedure Laterality Date   MAXILLARY CYST EXCISION Left 2006   Family Status  Relation Name Status   Mother  Alive   Father  Alive   Family History  Problem Relation Age of Onset   Cancer Mother 62       breast   Social History   Socioeconomic History   Marital status: Married    Spouse name: Not on file   Number of children: Not on file   Years of education: Not on file   Highest education level: Not on file  Occupational History   Not on file  Tobacco Use   Smoking status: Every Day    Packs/day: 1.00    Types: Cigarettes   Smokeless tobacco: Never  Vaping Use   Vaping Use: Never used  Substance and Sexual Activity   Alcohol use: No   Drug use: No   Sexual activity: Yes    Birth control/protection: Spermicide  Other Topics Concern   Not on file  Social History Narrative   Not on file   Social Determinants of Health   Financial Resource Strain: Not on file  Food Insecurity: Not on file  Transportation Needs: Not on file  Physical Activity: Not on file  Stress: Not on file  Social Connections: Not on file   No outpatient medications prior to visit.   No facility-administered medications prior to visit.   Allergies  Allergen Reactions   Ciprofloxacin Swelling    facial     There is no  immunization history on file for this patient.  Health Maintenance  Topic Date Due   COVID-19 Vaccine (1) Never done   HIV Screening  Never done   Hepatitis C Screening  Never done   DTaP/Tdap/Td (1 - Tdap) Never done   INFLUENZA VACCINE  Never done   PAP SMEAR-Modifier  02/10/2024   HPV VACCINES  Aged Out    Patient Care Team: Patient, No Pcp Per as PCP - General ([place]) Byrnett, Forest Gleason, MD (General Surgery)  Review of Systems  {Labs  Heme  Chem  Endocrine  Serology  Results Review (optional):23779}   Objective    There were no vitals taken for this visit. {Show previous vital signs (optional):23777}  Physical Exam ***  Depression Screen     No data to display         No results found for any visits on 07/18/22.  Assessment & Plan     ***  No follow-ups on file.     I, Mikey Kirschner, PA-C have reviewed all documentation for this visit. The documentation on  07/18/22 for the exam, diagnosis, procedures, and orders are all accurate and complete.  Mikey Kirschner, PA-C [facility] [address] [place], [place], [address] Office: [phone] Fax:  [place]

## 2023-02-28 ENCOUNTER — Telehealth: Payer: Self-pay | Admitting: Physician Assistant

## 2023-02-28 NOTE — Telephone Encounter (Signed)
Cindy Vazquez is calling in because she scheduled a NP appointment with [name] for 04/16/23. Cindy Vazquez wants to know does she have to wait until her appointment with [name] to get a referral for a mammogram? Cindy Vazquez says she has lumps in her breast and with a history of breast cancer she is nervous. Please follow up with Cindy Vazquez.

## 2023-03-01 NOTE — Telephone Encounter (Signed)
Mailbox full

## 2023-03-09 ENCOUNTER — Telehealth: Payer: Self-pay

## 2023-03-09 NOTE — Telephone Encounter (Signed)
Pt calling triage saying she called [facility] to schedule an appt for a mammogram and was advised they cannot unless we put a referral in. Advised pt she hasn't been seen since 01/2021, aware to schedule annual exam. Would like to know if MMF would be willing to put an order in before her annual appt, mom has hx of breast cancer. Would she need to be seen for a breast exam. Aware MMF returns to clinic Tuesday.

## 2023-03-22 NOTE — Progress Notes (Signed)
GYNECOLOGY ANNUAL PHYSICAL EXAM PROGRESS NOTE  Subjective:    Cindy Vazquez is a 47 y.o. G2P2 female who presents for an annual exam.  The patient is sexually active. The patient participates in regular exercise: no. Has the patient ever been transfused or tattooed?: yes. The patient reports that there is not domestic violence in her life.  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  She is married. She has been a smoker  long term, and at a ppd habit. She indicates a desire to quit today.  Her family history is significant for  Breast Cancer. Her mother was diagnosed in her 73s with breast Cancer and had a bilateral mastectomy. She has not had BRCA testing.  The patient has the following complaints today: Right breast pain and lump in right breast.  Menstrual History: Menarche age: 59 No LMP recorded.   Her periods are regular but have been occurring more frequent- q 21 days, lasting 3-4 days.She has been thinking about getting an endometrial ablation.  Gynecologic History:  Contraception:  spermicidal film History of STI's: none Last Pap: 02/09/21. Results were: normal. Denies/Notes h/o abnormal pap smears. Last mammogram: overdue. Results were: In 2014 she had an abnormal finding on a mammogram. Mo further mammogram reports found       OB History  Gravida Para Term Preterm AB Living  2 2 0 0 0 2  SAB IAB Ectopic Multiple Live Births  0 0 0 0 0    # Outcome Date GA Lbr Len/2nd Weight Sex Type Anes PTL Lv  2 Para           1 Para             Obstetric Comments  1st Menstrual Cycle:  16  1st Pregnancy:  22    Past Medical History:  Diagnosis Date   Breast lump 2014   Depression     Past Surgical History:  Procedure Laterality Date   MAXILLARY CYST EXCISION Left 2006    Family History  Problem Relation Age of Onset   Cancer Mother 67        breast    Social History   Socioeconomic History   Marital status: Married    Spouse name: Not on file   Number of children: Not on file   Years of education: Not on file   Highest education level: Not on file  Occupational History   Not on file  Tobacco Use   Smoking status: Every Day    Current packs/day: 1.00    Types: Cigarettes   Smokeless tobacco: Never  Vaping Use   Vaping status: Never Used  Substance and Sexual Activity   Alcohol use: No   Drug use: No   Sexual activity: Yes    Birth control/protection: Spermicide  Other Topics Concern   Not on file  Social History Narrative   Not on file   Social Determinants of Health   Financial Resource Strain: Not on file  Food Insecurity: Not on file  Transportation Needs: Not on file  Physical Activity: Not on file  Stress: Not on file  Social Connections: Not on file  Intimate Partner Violence: Not on file    No current outpatient medications on file prior to visit.   No current facility-administered medications on file prior to visit.    Allergies  Allergen Reactions   Ciprofloxacin Swelling  facial     Review of Systems Constitutional: negative for chills, fatigue, fevers and sweats Eyes: negative for irritation, redness and visual disturbance Ears, nose, mouth, throat, and face: negative for hearing loss, nasal congestion, snoring and tinnitus Respiratory: negative for asthma, cough, sputum Cardiovascular: negative for chest pain, dyspnea, exertional chest pressure/discomfort, irregular heart beat, palpitations and syncope Gastrointestinal: negative for abdominal pain, change in bowel habits, nausea and vomiting Genitourinary: negative for abnormal menstrual periods, genital lesions, sexual problems and vaginal discharge, dysuria and urinary incontinence Integument/breast: negative for breast lump, breast tenderness and nipple discharge Hematologic/lymphatic: negative for bleeding and easy  bruising Musculoskeletal:negative for back pain and muscle weakness Neurological: negative for dizziness, headaches, vertigo and weakness Endocrine: negative for diabetic symptoms including polydipsia, polyuria and skin dryness Allergic/Immunologic: negative for hay fever and urticaria      Objective:  There were no vitals taken for this visit. There is no height or weight on file to calculate BMI.    General Appearance:    Alert, cooperative, no distress, appears stated age  Head:    Normocephalic, without obvious abnormality, atraumatic  Eyes:    PERRL, conjunctiva/corneas clear, EOM's intact, both eyes  Ears:    Normal external ear canals, both ears        Neck:   Supple, symmetrical, trachea midline, no adenopathy; thyroid: no enlargement/tenderness/nodules; no carotid bruit or JVD  Back:     Symmetric, no curvature, ROM normal, no CVA tenderness  Lungs:     Clear to auscultation bilaterally, respirations unlabored  Chest Wall:    No tenderness or deformity   Heart:    Regular rate and rhythm, S1 and S2 normal, no murmur, rub or gallop  Breast Exam:    Right breast has thickened area of tissue in the upper outer quadrant of this breast as well as some lobular lumps. Fibrocystic changes, no nipple discharge, nipples are everted. There is tenderness in the upper out quadrant. The left breast also has fibrocystic changes and the upper outer quadrant is thickened   Abdomen:     Soft, non-tender, bowel sounds active all four quadrants, no masses, no organomegaly.    Genitalia:    Pelvic:external genitalia normal, vagina without lesions, discharge, or tenderness, rectovaginal septum  normal. Shaves entire mons.Cervix normal in appearance, no cervical motion tenderness, no adnexal masses or tenderness. Scant vaginal discharge Uterus normal size, shape, mobile, retroverted,regular contours, nontender.  Rectal:    Normal external sphincter.  No hemorrhoids appreciated. Internal exam not done.    Extremities:   Extremities normal, atraumatic, no cyanosis or edema  Pulses:   2+ and symmetric all extremities  Skin:   Skin color, texture, turgor normal, no rashes or lesions  Lymph nodes:   Cervical, supraclavicular, and axillary nodes normal  Neurologic:   CNII-XII intact, normal strength, sensation and reflexes throughout   .  Labs:  Lab Results  Component Value Date   WBC 6.6 04/12/2015   HGB 13.3 04/12/2015   HCT 41.3 04/12/2015   MCV 90.9 04/12/2015   PLT 302 04/12/2015     Assessment:   1. Encounter for annual routine gynecological examination   2. Family history of breast cancer - abnormal breast exam today- bilateral breast masses and lobular lumps both breasts.  3. Desires for assistance with smoking cessation. Interested in medication (Wellbutrin) trial. Plan:  Blood tests: per her PCP.. I have ordered a diagnostic mammogram and ultrasound to evaluate palpable breast masses.Discussed the importance of this being done,  especially in light of her mother's Dx in her 29s. Info on [employer] testing encouraged. She will contact the company for genetic screening.  Contraception:  she has not used hormonal birth control due to mood changes previously Continues with spermicidal film . Discussed healthy lifestyle modifications. Mammogram ordered- diagnostic and ultrasound also ordered  to be done soon. Pap smear ordered. Aptima swab sent  to test for BV, yeast. Follow up based on her mammo results and I 4 weeks for medication review (smoking cessation) Started on Wellbutrin 150 XL to assist with smoking cessation. I have reviewed the risk benefits of this class of medication. She will notify us of any SIs or negative side effects.  Mirna Mires, CNM  03/23/2023 12:09 PM   [facility]

## 2023-03-23 ENCOUNTER — Other Ambulatory Visit: Payer: Self-pay | Admitting: Obstetrics

## 2023-03-23 ENCOUNTER — Other Ambulatory Visit (HOSPITAL_COMMUNITY)
Admission: RE | Admit: 2023-03-23 | Discharge: 2023-03-23 | Disposition: A | Discharge status: Home / Self Care | Payer: 59 | Source: Ambulatory Visit | Attending: Obstetrics | Admitting: Obstetrics

## 2023-03-23 ENCOUNTER — Ambulatory Visit (INDEPENDENT_AMBULATORY_CARE_PROVIDER_SITE_OTHER): Payer: 59 | Admitting: Obstetrics

## 2023-03-23 ENCOUNTER — Encounter: Payer: Self-pay | Admitting: Obstetrics

## 2023-03-23 VITALS — BP 111/75 | HR 81 | Ht 60.0 in | Wt 102.6 lb

## 2023-03-23 DIAGNOSIS — Z803 Family history of malignant neoplasm of breast: Secondary | ICD-10-CM

## 2023-03-23 DIAGNOSIS — N63 Unspecified lump in unspecified breast: Secondary | ICD-10-CM

## 2023-03-23 DIAGNOSIS — N898 Other specified noninflammatory disorders of vagina: Secondary | ICD-10-CM

## 2023-03-23 DIAGNOSIS — N6311 Unspecified lump in the right breast, upper outer quadrant: Secondary | ICD-10-CM

## 2023-03-23 DIAGNOSIS — Z716 Tobacco abuse counseling: Secondary | ICD-10-CM

## 2023-03-23 DIAGNOSIS — Z1231 Encounter for screening mammogram for malignant neoplasm of breast: Secondary | ICD-10-CM

## 2023-03-23 DIAGNOSIS — Z01419 Encounter for gynecological examination (general) (routine) without abnormal findings: Secondary | ICD-10-CM

## 2023-03-23 DIAGNOSIS — Z124 Encounter for screening for malignant neoplasm of cervix: Secondary | ICD-10-CM | POA: Insufficient documentation

## 2023-03-23 MED ORDER — BUPROPION HCL ER (XL) 150 MG PO TB24: 150.0000 mg | ORAL_TABLET | Freq: Every day | ORAL | 3 refills | Status: DC

## 2023-03-26 ENCOUNTER — Encounter: Payer: Self-pay | Admitting: Obstetrics

## 2023-03-26 ENCOUNTER — Ambulatory Visit
Admission: RE | Admit: 2023-03-26 | Discharge: 2023-03-26 | Disposition: A | Discharge status: Home / Self Care | Payer: 59 | Source: Ambulatory Visit | Attending: Obstetrics | Admitting: Obstetrics

## 2023-03-26 ENCOUNTER — Other Ambulatory Visit: Payer: Self-pay | Admitting: Obstetrics

## 2023-03-26 ENCOUNTER — Inpatient Hospital Stay
Admission: RE | Admit: 2023-03-26 | Discharge: 2023-03-26 | Discharge status: Home / Self Care | Payer: 59 | Source: Ambulatory Visit | Attending: Obstetrics

## 2023-03-26 DIAGNOSIS — Z803 Family history of malignant neoplasm of breast: Secondary | ICD-10-CM

## 2023-03-26 DIAGNOSIS — N63 Unspecified lump in unspecified breast: Secondary | ICD-10-CM

## 2023-03-26 DIAGNOSIS — N644 Mastodynia: Secondary | ICD-10-CM | POA: Insufficient documentation

## 2023-03-26 DIAGNOSIS — N6311 Unspecified lump in the right breast, upper outer quadrant: Secondary | ICD-10-CM | POA: Diagnosis not present

## 2023-03-26 DIAGNOSIS — B3731 Acute candidiasis of vulva and vagina: Secondary | ICD-10-CM

## 2023-03-26 DIAGNOSIS — Z1231 Encounter for screening mammogram for malignant neoplasm of breast: Secondary | ICD-10-CM

## 2023-03-26 LAB — CERVICOVAGINAL ANCILLARY ONLY
Bacterial Vaginitis (gardnerella): NEGATIVE
Candida Glabrata: NEGATIVE
Candida Vaginitis: POSITIVE — AB
Comment: NEGATIVE
Comment: NEGATIVE
Comment: NEGATIVE

## 2023-03-26 MED ORDER — FLUCONAZOLE 150 MG PO TABS: 150.0000 mg | ORAL_TABLET | Freq: Once | ORAL | 1 refills | Status: AC

## 2023-03-28 LAB — CYTOLOGY - PAP
Comment: NEGATIVE
Diagnosis: NEGATIVE
High risk HPV: NEGATIVE

## 2023-04-03 ENCOUNTER — Other Ambulatory Visit: Payer: 59

## 2023-04-16 ENCOUNTER — Ambulatory Visit: Payer: Self-pay | Admitting: Physician Assistant

## 2023-04-23 ENCOUNTER — Ambulatory Visit: Payer: 59 | Admitting: Obstetrics

## 2023-05-04 NOTE — Progress Notes (Unsigned)
    GYNECOLOGY PROGRESS NOTE  Subjective:    Patient ID: Cindy Vazquez, female    DOB: [birth year], 47 y.o.   MRN: [id number]  HPI  Patient is a 47 y.o. G2P2 female who presents for Mediation Management. She has been a smoker long term, and at a ppd habit. She indicates a desire to quit today. Started on Wellbutrin 150 XL to assist with smoking cessation on 03/23/23.  {Common ambulatory SmartLinks:19316}  Review of Systems {ros; complete:30496}   Objective:   There were no vitals taken for this visit. There is no height or weight on file to calculate BMI. General appearance: {general exam:16600} Abdomen: {abdominal exam:16834} Pelvic: {pelvic exam:16852::"cervix normal in appearance","external genitalia normal","no adnexal masses or tenderness","no cervical motion tenderness","rectovaginal septum normal","uterus normal size, shape, and consistency","vagina normal without discharge"} Extremities: {extremity exam:5109} Neurologic: {neuro exam:17854}   Assessment:   1. Encounter for smoking cessation counseling      Plan:   There are no diagnoses linked to this encounter.     Paula Compton, CNM [facility]

## 2023-05-07 ENCOUNTER — Ambulatory Visit: Payer: 59 | Admitting: Obstetrics

## 2023-05-07 DIAGNOSIS — Z716 Tobacco abuse counseling: Secondary | ICD-10-CM

## 2023-05-18 ENCOUNTER — Telehealth: Payer: Managed Care, Other (non HMO) | Admitting: Physician Assistant

## 2023-05-18 DIAGNOSIS — N760 Acute vaginitis: Secondary | ICD-10-CM | POA: Diagnosis not present

## 2023-05-19 MED ORDER — FLUCONAZOLE 150 MG PO TABS: 150.0000 mg | ORAL_TABLET | Freq: Once | ORAL | 0 refills | Status: AC

## 2023-05-19 NOTE — Progress Notes (Deleted)
 New patient visit  Patient: Cindy Vazquez   DOB: 1975/10/26   47 y.o. Female  MRN: [id number] Visit Date: 05/29/2023  Today's healthcare provider: Debera Lat, PA-C   No chief complaint on file.  Subjective    Cindy Vazquez is a 47 y.o. female who presents today as a new patient to establish care.  HPI  *** Discussed the use of AI scribe software for clinical note transcription with the patient, who gave verbal consent to proceed.  History of Present Illness            Past Medical History:  Diagnosis Date   Atypical squamous cell changes of undetermined significance (ASCUS) on cervical cytology with negative high risk human papilloma virus (HPV) test result 09/11/2016   Breast lump 05/22/2012   Depression    Past Surgical History:  Procedure Laterality Date   MAXILLARY CYST EXCISION Left 2006   Family Status  Relation Name Status   Mother  Alive   Father  Alive  No partnership data on file   Family History  Problem Relation Age of Onset   Breast cancer Mother    Cancer Mother 7       breast   Social History   Socioeconomic History   Marital status: Married    Spouse name: Not on file   Number of children: Not on file   Years of education: Not on file   Highest education level: Not on file  Occupational History   Not on file  Tobacco Use   Smoking status: Every Day    Current packs/day: 1.00    Types: Cigarettes   Smokeless tobacco: Never  Vaping Use   Vaping status: Never Used  Substance and Sexual Activity   Alcohol use: No   Drug use: No   Sexual activity: Yes    Birth control/protection: Spermicide  Other Topics Concern   Not on file  Social History Narrative   Not on file   Social Drivers of Health   Financial Resource Strain: Not on file  Food Insecurity: Not on file  Transportation Needs: Not on file  Physical Activity: Not on file  Stress: Not on file  Social Connections: Not on file   Outpatient Medications Prior to  Visit  Medication Sig   buPROPion (WELLBUTRIN XL) 150 MG 24 hr tablet Take 1 tablet (150 mg total) by mouth daily.   No facility-administered medications prior to visit.   Allergies  Allergen Reactions   Ciprofloxacin Swelling    facial     There is no immunization history on file for this patient.  Health Maintenance  Topic Date Due   COVID-19 Vaccine (1) Never done   HIV Screening  Never done   Hepatitis C Screening  Never done   DTaP/Tdap/Td (1 - Tdap) Never done   Colonoscopy  Never done   INFLUENZA VACCINE  Never done   MAMMOGRAM  03/25/2024   Cervical Cancer Screening (HPV/Pap Cotest)  03/22/2028   HPV VACCINES  Aged Out    Patient Care Team: Patient, No Pcp Per as PCP - General (General Practice) Byrnett, Merrily Pew, MD (General Surgery)  Review of Systems  All other systems reviewed and are negative.  Except see HPI   {Insert previous labs (optional):23779} {See past labs  Heme  Chem  Endocrine  Serology  Results Review (optional):1}   Objective    There were no vitals taken for this visit. {Insert last BP/Wt (optional):23777}{See vitals history (optional):1}   Physical Exam  Vitals reviewed.  Constitutional:      General: She is not in acute distress.    Appearance: Normal appearance. She is well-developed. She is not diaphoretic.  HENT:     Head: Normocephalic and atraumatic.  Eyes:     General: No scleral icterus.    Conjunctiva/sclera: Conjunctivae normal.  Neck:     Thyroid: No thyromegaly.  Cardiovascular:     Rate and Rhythm: Normal rate and regular rhythm.     Pulses: Normal pulses.     Heart sounds: Normal heart sounds. No murmur heard. Pulmonary:     Effort: Pulmonary effort is normal. No respiratory distress.     Breath sounds: Normal breath sounds. No wheezing, rhonchi or rales.  Musculoskeletal:     Cervical back: Neck supple.     Right lower leg: No edema.     Left lower leg: No edema.  Lymphadenopathy:     Cervical: No  cervical adenopathy.  Skin:    General: Skin is warm and dry.     Findings: No rash.  Neurological:     Mental Status: She is alert and oriented to person, place, and time. Mental status is at baseline.  Psychiatric:        Mood and Affect: Mood normal.        Behavior: Behavior normal.     Depression Screen    03/23/2023    3:52 PM  PHQ 2/9 Scores  PHQ - 2 Score 1  PHQ- 9 Score 3   No results found for any visits on 05/29/23.  Assessment & Plan     *** Assessment and Plan              Encounter to establish care Welcomed to our clinic Reviewed past medical hx, social hx, family hx and surgical hx Pt advised to send all vaccination records or screening   No follow-ups on file.    The patient was advised to call back or seek an in-person evaluation if the symptoms worsen or if the condition fails to improve as anticipated.  I discussed the assessment and treatment plan with the patient. The patient was provided an opportunity to ask questions and all were answered. The patient agreed with the plan and demonstrated an understanding of the instructions.  I, Debera Lat, PA-C have reviewed all documentation for this visit. The documentation on  05/29/2023   for the exam, diagnosis, procedures, and orders are all accurate and complete.  Debera Lat, [facility], MMS [facility] [phone] (phone) [phone] (fax)  [facility]

## 2023-05-19 NOTE — Progress Notes (Signed)
I have spent 5 minutes in review of e-visit questionnaire, review and updating patient chart, medical decision making and response to patient.   Laure Kidney, PA-C

## 2023-05-19 NOTE — Progress Notes (Signed)

## 2023-05-29 ENCOUNTER — Ambulatory Visit: Payer: Self-pay | Admitting: Physician Assistant

## 2023-05-29 DIAGNOSIS — Z7689 Persons encountering health services in other specified circumstances: Secondary | ICD-10-CM

## 2023-05-30 NOTE — Progress Notes (Deleted)
    GYNECOLOGY PROGRESS NOTE  Subjective:    Patient ID: Cindy Vazquez, female    DOB: [birth year], 48 y.o.   MRN: [id number]  HPI  Patient is a 48 y.o. G2P2 female who presents for consultation for Endometrial Ablation.  {Common ambulatory SmartLinks:19316}  Review of Systems {ros; complete:30496}   Objective:   There were no vitals taken for this visit. There is no height or weight on file to calculate BMI. General appearance: {general exam:16600} Abdomen: {abdominal exam:16834} Pelvic: {pelvic exam:16852::cervix normal in appearance,external genitalia normal,no adnexal masses or tenderness,no cervical motion tenderness,rectovaginal septum normal,uterus normal size, shape, and consistency,vagina normal without discharge} Extremities: {extremity exam:5109} Neurologic: {neuro exam:17854}   Assessment:   No diagnosis found.   Plan:   There are no diagnoses linked to this encounter.    Archie Savers, MD  [facility]

## 2023-05-31 ENCOUNTER — Ambulatory Visit: Payer: Managed Care, Other (non HMO) | Admitting: Obstetrics and Gynecology

## 2023-06-06 ENCOUNTER — Encounter: Payer: Self-pay | Admitting: Obstetrics and Gynecology

## 2023-08-29 ENCOUNTER — Encounter: Payer: Self-pay | Admitting: Obstetrics

## 2023-11-19 ENCOUNTER — Encounter: Payer: Self-pay | Admitting: General Practice

## 2023-11-19 ENCOUNTER — Ambulatory Visit: Payer: Self-pay | Admitting: General Practice

## 2023-11-19 ENCOUNTER — Ambulatory Visit (INDEPENDENT_AMBULATORY_CARE_PROVIDER_SITE_OTHER): Admitting: General Practice

## 2023-11-19 VITALS — BP 118/66 | HR 78 | Temp 99.0°F | Ht 59.5 in | Wt 109.0 lb

## 2023-11-19 DIAGNOSIS — Z7689 Persons encountering health services in other specified circumstances: Secondary | ICD-10-CM | POA: Insufficient documentation

## 2023-11-19 DIAGNOSIS — G2581 Restless legs syndrome: Secondary | ICD-10-CM | POA: Insufficient documentation

## 2023-11-19 DIAGNOSIS — N924 Excessive bleeding in the premenopausal period: Secondary | ICD-10-CM | POA: Diagnosis not present

## 2023-11-19 DIAGNOSIS — G4700 Insomnia, unspecified: Secondary | ICD-10-CM | POA: Diagnosis not present

## 2023-11-19 LAB — COMPREHENSIVE METABOLIC PANEL WITH GFR
ALT: 9 U/L (ref 0–35)
AST: 17 U/L (ref 0–37)
Albumin: 4.5 g/dL (ref 3.5–5.2)
Alkaline Phosphatase: 62 U/L (ref 39–117)
BUN: 7 mg/dL (ref 6–23)
CO2: 28 meq/L (ref 19–32)
Calcium: 9.1 mg/dL (ref 8.4–10.5)
Chloride: 103 meq/L (ref 96–112)
Creatinine, Ser: 0.79 mg/dL (ref 0.40–1.20)
GFR: 89.96 mL/min (ref >60.00)
Glucose, Bld: 95 mg/dL (ref 70–99)
Potassium: 3.9 meq/L (ref 3.5–5.1)
Sodium: 137 meq/L (ref 135–145)
Total Bilirubin: 0.3 mg/dL (ref 0.2–1.2)
Total Protein: 7.2 g/dL (ref 6.0–8.3)

## 2023-11-19 LAB — CBC
HCT: 39.8 % (ref 36.0–46.0)
Hemoglobin: 13.1 g/dL (ref 12.0–15.0)
MCHC: 32.8 g/dL (ref 30.0–36.0)
MCV: 87 fl (ref 78.0–100.0)
Platelets: 334 10*3/uL (ref 150.0–400.0)
RBC: 4.57 Mil/uL (ref 3.87–5.11)
RDW: 14.2 % (ref 11.5–15.5)
WBC: 5.9 10*3/uL (ref 4.0–10.5)

## 2023-11-19 LAB — HEMOGLOBIN A1C: Hgb A1c MFr Bld: 5.5 % (ref 4.6–6.5)

## 2023-11-19 LAB — TSH: TSH: 10.11 u[IU]/mL — ABNORMAL HIGH (ref 0.35–5.50)

## 2023-11-19 MED ORDER — GABAPENTIN 100 MG PO CAPS: 100.0000 mg | ORAL_CAPSULE | Freq: Every day | ORAL | 3 refills | Status: DC

## 2023-11-19 NOTE — Assessment & Plan Note (Signed)
 Uncontrolled.  Discussed treatment options.   Start Gabapentin 100 mg at bedtime daily. Rx sent.   F/u in 6 months.

## 2023-11-19 NOTE — Assessment & Plan Note (Signed)
 Unclear etiology.  Declines pelvic exam or work up for this today.  She will call her GYN today and get an appointment to discuss her symptoms.  Will continue to monitor.

## 2023-11-19 NOTE — Patient Instructions (Addendum)
 Stop by the lab prior to leaving today. I will notify you of your results once received.   Start Gabapentin 100 mg once daily at bedtime.   You can also try Ashwaganda.   Follow up in 6 weeks.  It was a pleasure to meet you today! Please don't hesitate to contact me with any questions. Welcome to [facility]!

## 2023-11-19 NOTE — Progress Notes (Signed)
 New Patient Office Visit  Subjective    Patient ID: Cindy Vazquez, female    DOB: 12-13-1975  Age: 48 y.o. MRN: [id number]  CC:  Chief Complaint  Patient presents with   New Patient (Initial Visit)    Discuss perimenopausal sx and needing ablation done at some point     HPI Cindy Vazquez is a 48 y.o. female presents to establish care.  Last pcp/physical/labs: OBGYN. Last physical and labs and pap in November, 2024.   Perimenopausal: Has been having painful, heavy periods for about a year. She was being followed by OBGYN however her physician just retired. She was told that she needed ablation. She was established with [facility]. She feels like her she has urine odor and had has been going on for a year. She was treated for BV in the past but she is not sure if it is related to this. Denies any hot flashes, lower abdominal pain, flank pain, dysuria or hematuria. She does not recall what she was treated with. She had a pap smear in November, 2024 which was within normal range.   Insomnia/restless leg syndrome: ongoing for about a year. Feels exhausted. Feels like she is always shaking her leg. She has a high stressed job and she is not sure if this secondary to her work or something. She can go to sleep easily but wakes up frequently. At least 2-3 times every night. She feels waking up more tired. She denies any snoring, difficulty breathing, chest pain, shortness of breath.    Outpatient Encounter Medications as of 11/19/2023  Medication Sig   gabapentin (NEURONTIN) 100 MG capsule Take 1 capsule (100 mg total) by mouth at bedtime.   [DISCONTINUED] buPROPion  (WELLBUTRIN  XL) 150 MG 24 hr tablet Take 1 tablet (150 mg total) by mouth daily.   No facility-administered encounter medications on file as of 11/19/2023.    Past Medical History:  Diagnosis Date   Atypical squamous cell changes of undetermined significance (ASCUS) on cervical cytology with negative high risk human  papilloma virus (HPV) test result 09/11/2016   Breast lump 05/22/2012   Depression     Past Surgical History:  Procedure Laterality Date   MAXILLARY CYST EXCISION Left 2006    Family History  Problem Relation Age of Onset   Breast cancer Mother    Cancer Mother 68       breast    Social History   Socioeconomic History   Marital status: Married    Spouse name: Not on file   Number of children: Not on file   Years of education: Not on file   Highest education level: Not on file  Occupational History   Not on file  Tobacco Use   Smoking status: Every Day    Current packs/day: 1.00    Types: Cigarettes   Smokeless tobacco: Never  Vaping Use   Vaping status: Never Used  Substance and Sexual Activity   Alcohol use: No   Drug use: No   Sexual activity: Yes    Birth control/protection: Spermicide  Other Topics Concern   Not on file  Social History Narrative   Not on file   Social Drivers of Health   Financial Resource Strain: Not on file  Food Insecurity: Not on file  Transportation Needs: Not on file  Physical Activity: Not on file  Stress: Not on file  Social Connections: Not on file  Intimate Partner Violence: Not on file    Review of Systems  Constitutional:  Negative for chills and fever.  Respiratory:  Negative for shortness of breath.   Cardiovascular:  Negative for chest pain.  Gastrointestinal:  Negative for abdominal pain, constipation, diarrhea, heartburn, nausea and vomiting.  Genitourinary:  Negative for dysuria, frequency and urgency.  Neurological:  Negative for dizziness and headaches.  Endo/Heme/Allergies:  Negative for polydipsia.  Psychiatric/Behavioral:  Negative for depression and suicidal ideas. The patient is not nervous/anxious.         Objective    BP 118/66   Pulse 78   Temp 99 F (37.2 C) (Temporal)   Ht 4' 11.5 (1.511 m)   Wt 109 lb (49.4 kg)   SpO2 97%   BMI 21.65 kg/m   Physical Exam Vitals and nursing note  reviewed.  Constitutional:      Appearance: Normal appearance.   Cardiovascular:     Rate and Rhythm: Normal rate and regular rhythm.     Pulses: Normal pulses.     Heart sounds: Normal heart sounds.  Pulmonary:     Effort: Pulmonary effort is normal.     Breath sounds: Normal breath sounds.   Neurological:     Mental Status: She is alert and oriented to person, place, and time.   Psychiatric:        Mood and Affect: Mood normal.        Behavior: Behavior normal.        Thought Content: Thought content normal.        Judgment: Judgment normal.         Assessment & Plan:  Excessive bleeding in premenopausal period Assessment & Plan: Unclear etiology.  Declines pelvic exam or work up for this today.  She will call her GYN today and get an appointment to discuss her symptoms.  Will continue to monitor.  Orders: -     CBC -     Comprehensive metabolic panel with GFR -     TSH  Establishing care with new doctor, encounter for Assessment & Plan: EMR reviewed briefly.    Restless leg syndrome Assessment & Plan: Uncontrolled.  Discussed treatment options.   Start Gabapentin 100 mg at bedtime daily. Rx sent.   F/u in 6 months.  Orders: -     Gabapentin; Take 1 capsule (100 mg total) by mouth at bedtime.  Dispense: 30 capsule; Refill: 3 -     TSH  Insomnia, unspecified type Assessment & Plan: Unclear etiology.  Suspect this could be secondary to anxiety.   Discussed treatment options.  Declines therapy and antidepressant at this time.   Labs pending to rule out metabolic cause. Await results. F/u in 6 months.  Orders: -     CBC -     Comprehensive metabolic panel with GFR -     TSH -     Hemoglobin A1c     Return in about 6 weeks (around 12/31/2023) for restless leg syndrome f/u.   Carrol Aurora, NP

## 2023-11-19 NOTE — Assessment & Plan Note (Addendum)
 Unclear etiology.  Suspect this could be secondary to anxiety.   Discussed treatment options.  Declines therapy and antidepressant at this time.   Labs pending to rule out metabolic cause. Await results. F/u in 6 months.

## 2023-11-19 NOTE — Assessment & Plan Note (Signed)
 EMR reviewed briefly.

## 2023-12-18 ENCOUNTER — Other Ambulatory Visit: Payer: Self-pay | Admitting: General Practice

## 2023-12-18 DIAGNOSIS — N63 Unspecified lump in unspecified breast: Secondary | ICD-10-CM

## 2023-12-24 ENCOUNTER — Other Ambulatory Visit: Payer: Self-pay | Admitting: General Practice

## 2023-12-24 DIAGNOSIS — N6310 Unspecified lump in the right breast, unspecified quadrant: Secondary | ICD-10-CM

## 2023-12-24 DIAGNOSIS — N6325 Unspecified lump in the left breast, overlapping quadrants: Secondary | ICD-10-CM

## 2024-01-01 ENCOUNTER — Ambulatory Visit
Admission: RE | Admit: 2024-01-01 | Discharge: 2024-01-01 | Disposition: A | Discharge status: Home / Self Care | Source: Ambulatory Visit | Attending: General Practice | Admitting: General Practice

## 2024-01-01 ENCOUNTER — Ambulatory Visit (INDEPENDENT_AMBULATORY_CARE_PROVIDER_SITE_OTHER): Admitting: General Practice

## 2024-01-01 ENCOUNTER — Ambulatory Visit: Payer: Self-pay | Admitting: General Practice

## 2024-01-01 ENCOUNTER — Encounter: Payer: Self-pay | Admitting: General Practice

## 2024-01-01 ENCOUNTER — Other Ambulatory Visit: Payer: Self-pay | Admitting: General Practice

## 2024-01-01 VITALS — BP 112/80 | HR 67 | Temp 98.3°F | Ht 59.0 in | Wt 108.0 lb

## 2024-01-01 DIAGNOSIS — G4700 Insomnia, unspecified: Secondary | ICD-10-CM | POA: Diagnosis not present

## 2024-01-01 DIAGNOSIS — N63 Unspecified lump in unspecified breast: Secondary | ICD-10-CM

## 2024-01-01 DIAGNOSIS — G2581 Restless legs syndrome: Secondary | ICD-10-CM

## 2024-01-01 DIAGNOSIS — F419 Anxiety disorder, unspecified: Secondary | ICD-10-CM | POA: Diagnosis not present

## 2024-01-01 DIAGNOSIS — N6325 Unspecified lump in the left breast, overlapping quadrants: Secondary | ICD-10-CM | POA: Diagnosis present

## 2024-01-01 MED ORDER — HYDROXYZINE HCL 10 MG PO TABS: 10.0000 mg | ORAL_TABLET | Freq: Every evening | ORAL | 0 refills | Status: DC | PRN

## 2024-01-01 NOTE — Assessment & Plan Note (Addendum)
 Suspect situational anxiety.  Discussed treatment options such as Ashwaganda and hydroxyzine .   Rx sent for hydroxyzine  10-20 mg at bedtime PRN.   F/u  in November.

## 2024-01-01 NOTE — Progress Notes (Signed)
 Established Patient Office Visit  Subjective   Patient ID: Cindy Vazquez, female    DOB: 26-Aug-1975  Age: 48 y.o. MRN: [id number]  Chief Complaint  Patient presents with   restless leg     Patient here today to follow up; taking gabapentin  100 mg nightly. Patient states it is helping but is still waking up some in the night.     HPI  Cindy Vazquez is a 48 year old female with past medical history of fibroadenoma of right breast, restless leg syndrome and insomnia presents today for a follow up.   Restless leg: she was evaluated on 11/19/23 and was started on Gabapentin  100 mg at bedtime. Today she reports, she is doing much better. She does not have any symptoms after taking the medication.   Insomnia/Anxiety: still having some anxiety on Sundays related to work on Monday. It is causing her to toss and turn until 4 am due to mind racing thoughts and worrying. She is fine on other nights. She worries about getting into the routine. This causes her to feel tired throughout the week.   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   Past Medical History:  Diagnosis Date   Atypical squamous cell changes of undetermined significance (ASCUS) on cervical cytology with negative high risk human papilloma virus (HPV) test result 09/11/2016   Breast lump 05/22/2012   Depression    Past Surgical History:  Procedure Laterality Date   MAXILLARY CYST EXCISION Left 2006   Allergies  Allergen Reactions   Ciprofloxacin Swelling    facial         01/01/2024    3:29 PM 11/19/2023    2:10 PM 03/23/2023    3:52 PM  Depression screen PHQ 2/9  Decreased Interest 0 1 1  Down, Depressed, Hopeless 0 0 0  PHQ - 2 Score 0 1 1  Altered sleeping 2 2 1   Tired, decreased energy 2 2 1   Change in appetite 0 0 0   Feeling bad or failure about yourself  0 0 0  Trouble concentrating 0 0 0  Moving slowly or fidgety/restless 0 0 0  Suicidal thoughts 0 0 0  PHQ-9 Score 4 5 3   Difficult doing work/chores Somewhat difficult Somewhat difficult Somewhat difficult       01/01/2024    3:29 PM 11/19/2023    2:10 PM 03/23/2023    3:53 PM  GAD 7 : Generalized Anxiety Score  Nervous, Anxious, on Edge 0 0 0  Control/stop worrying 0 0 0  Worry too much - different things 0 0 0  Trouble relaxing 0 0 0  Restless 0 0 0  Easily annoyed or irritable 1 1 0  Afraid - awful might happen 0 0 0  Total GAD 7 Score 1 1 0  Anxiety Difficulty Not difficult at all Somewhat difficult       Review of Systems  Constitutional:  Negative for chills and fever.  Respiratory:  Negative for shortness of breath.   Cardiovascular:  Negative for chest pain.  Gastrointestinal:  Negative for abdominal pain, constipation, diarrhea, heartburn, nausea and vomiting.  Genitourinary:  Negative for dysuria, frequency and urgency.  Neurological:  Negative for dizziness and headaches.  Endo/Heme/Allergies:  Negative for polydipsia.  Psychiatric/Behavioral:  Negative for depression and suicidal ideas. The patient  is nervous/anxious.       Objective:     BP 112/80   Pulse 67   Temp 98.3 F (36.8 C) (Temporal)   Ht 4' 11 (1.499 m)   Wt 108 lb (49 kg)   LMP 12/21/2023   SpO2 97%   BMI 21.81 kg/m  BP Readings from Last 3 Encounters:  01/01/24 112/80  11/19/23 118/66  03/23/23 111/75   Wt Readings from Last 3 Encounters:  01/01/24 108 lb (49 kg)  11/19/23 109 lb (49.4 kg)  03/23/23 102 lb 9.6 oz (46.5 kg)      Physical Exam Vitals and nursing note reviewed.  Constitutional:      Appearance: Normal appearance.  Cardiovascular:     Rate and Rhythm: Normal rate and regular rhythm.     Pulses: Normal pulses.     Heart sounds: Normal heart sounds.  Pulmonary:     Effort: Pulmonary effort is normal.     Breath sounds:  Normal breath sounds.  Neurological:     Mental Status: She is alert and oriented to person, place, and time.  Psychiatric:        Mood and Affect: Mood normal.        Behavior: Behavior normal.        Thought Content: Thought content normal.        Judgment: Judgment normal.      No results found for any visits on 01/01/24.     The ASCVD Risk score (Arnett DK, et al., 2019) failed to calculate for the following reasons:   Cannot find a previous HDL lab   Cannot find a previous total cholesterol lab    Assessment & Plan:  Restless leg syndrome Assessment & Plan: Improved.  Continue Gabapentin  100 mg at bedtime.    Anxiety Assessment & Plan: Suspect situational anxiety.  Discussed treatment options such as Ashwaganda and hydroxyzine .   Rx sent for hydroxyzine  10-20 mg at bedtime PRN.   F/u  in November.  Orders: -     hydrOXYzine  HCl; Take 1-2 tablets (10-20 mg total) by mouth at bedtime as needed for anxiety.  Dispense: 30 tablet; Refill: 0  Insomnia, unspecified type Assessment & Plan: Discussed treatment options such as Ashwaganda and hydroxyzine .   Rx sent for hydroxyzine  10-20 mg at bedtime PRN.   F/u  in November.      Return in about 3 months (around 03/22/2024) for physical.    Carrol Aurora, NP

## 2024-01-01 NOTE — Assessment & Plan Note (Signed)
 Discussed treatment options such as Ashwaganda and hydroxyzine .   Rx sent for hydroxyzine  10-20 mg at bedtime PRN.   F/u  in November.

## 2024-01-01 NOTE — Assessment & Plan Note (Signed)
 Improved.  Continue Gabapentin  100 mg at bedtime.

## 2024-01-01 NOTE — Patient Instructions (Signed)
 START hydroxyzine  10 -20 mg at bedtime as needed.  Continue Gabapentin  100 mg at bedtime.   Follow up in November for physical.   It was a pleasure to see you today!

## 2024-01-21 ENCOUNTER — Encounter: Payer: Self-pay | Admitting: General Practice

## 2024-01-22 ENCOUNTER — Telehealth: Payer: Self-pay

## 2024-01-22 NOTE — Telephone Encounter (Signed)
 Copied from CRM #[id number]. Topic: Clinical - Medication Question >> Jan 22, 2024 12:32 PM Cindy Vazquez wrote: Reason for CRM: Patient has a yeast infection and wants to know if she can get something called in for her without making and appointment, and would like a return phone call.

## 2024-01-22 NOTE — Telephone Encounter (Signed)
 Patient states she can not take off work this week.

## 2024-01-22 NOTE — Telephone Encounter (Signed)
 Left a voicemail on the triage line. Patient reported symptoms and requested medication for yeast infection. Last seen at this office on 03/23/2023 and is due for her annual soon. Previous communication with her PCP noted green discharge with cottage cheese-like consistency and yeast smell.  I called the patient to inform her that, per our protocol and as previously stated by her PCP, she would need to come in for a nurse visit for proper evaluation. I am sending this information to the provider on call, as the patient was very persistent about wanting a prescription called in over the phone.  I explained that due to the green discharge, an in-person visit would be beneficial, but she stated she did not want to miss work. Sending to provider on call.

## 2024-01-22 NOTE — Telephone Encounter (Signed)
 Called patient to discuss this more as we can not prescribe medication without a visit. Patient got mad and wanted something called in as she 100% knows she has a yeast infection. I asked patient for her sx and she get mad cause she was not in a place where she wanted to discuss her sx. I asked her to either call back when she can discuss her sx or send them in the [employer]. She finally rattled off her sx which was that she had white and some greenish cottage cheese like discharge, yeast smell, and she's very itchy and swollen since Saturday. Patient states if Carrol can not do anything without coming into the office that she would do a teledoc appt. I advised the patient I would send the message and let her know what is decided.

## 2024-01-22 NOTE — Telephone Encounter (Signed)
 Noted.  I sent patient a message advising the patient that we would need an evaluation and a vaginal swab completed prior to treatment as the treatment is different for both conditions.

## 2024-01-23 NOTE — Telephone Encounter (Unsigned)
 Copied from CRM #[id number]. Topic: General - Other >> Jan 23, 2024  9:47 AM Deleta RAMAN wrote: Reason for CRM: missed call from office. Please follow up with the pt

## 2024-04-01 ENCOUNTER — Other Ambulatory Visit: Payer: Self-pay | Admitting: General Practice

## 2024-04-01 DIAGNOSIS — G2581 Restless legs syndrome: Secondary | ICD-10-CM

## 2024-04-02 ENCOUNTER — Telehealth: Payer: Self-pay | Admitting: General Practice

## 2024-04-02 NOTE — Telephone Encounter (Signed)
 Refill request for  Gabapentin  100 MG Oral Capsule   LOV - 01/01/24 Next OV - 04/04/24 Last refill - 11/19/23 #30/3

## 2024-04-02 NOTE — Telephone Encounter (Signed)
 Copied from CRM [id number]. Topic: Clinical - Prescription Issue >> Apr 02, 2024  1:08 PM Tysheama G wrote: Reason for CRM: Patient is calling regarding her prescription for Gabapentin  100 MG Oral Capsule ; she's been without medication for 2 days now. Callback number [phone]

## 2024-04-02 NOTE — Telephone Encounter (Signed)
 This medication has already been refilled by [facility] as of today 1:38pm. Pt has been made aware of this information. Nothing further was needed at this time.

## 2024-04-04 ENCOUNTER — Encounter: Admitting: General Practice

## 2024-04-04 DIAGNOSIS — Z0000 Encounter for general adult medical examination without abnormal findings: Secondary | ICD-10-CM

## 2024-06-03 ENCOUNTER — Telehealth: Payer: Self-pay

## 2024-06-03 NOTE — Telephone Encounter (Signed)
 Copied from CRM [id number]. Topic: Clinical - Medical Advice >> Jun 03, 2024  1:25 PM Chasity T wrote: Reason for CRM: Patient has concerns having symptoms in her throat from her hyperthyriod since they are high at the moment. I did make her an appointment but she wanted your suggestion on what to do. Please send her a message or call back  Phone number: [phone]

## 2024-06-03 NOTE — Telephone Encounter (Signed)
 Message sent to patient on mychart

## 2024-06-13 ENCOUNTER — Ambulatory Visit: Admitting: General Practice

## 2024-06-19 ENCOUNTER — Encounter: Payer: Self-pay | Admitting: General Practice

## 2024-06-19 ENCOUNTER — Ambulatory Visit: Admitting: General Practice

## 2024-06-19 ENCOUNTER — Ambulatory Visit: Payer: Self-pay | Admitting: General Practice

## 2024-06-19 VITALS — BP 114/80 | HR 78 | Temp 98.2°F | Ht 59.0 in | Wt 105.0 lb

## 2024-06-19 DIAGNOSIS — R7989 Other specified abnormal findings of blood chemistry: Secondary | ICD-10-CM

## 2024-06-19 DIAGNOSIS — F419 Anxiety disorder, unspecified: Secondary | ICD-10-CM | POA: Diagnosis not present

## 2024-06-19 DIAGNOSIS — G2581 Restless legs syndrome: Secondary | ICD-10-CM

## 2024-06-19 DIAGNOSIS — R221 Localized swelling, mass and lump, neck: Secondary | ICD-10-CM | POA: Diagnosis not present

## 2024-06-19 DIAGNOSIS — E039 Hypothyroidism, unspecified: Secondary | ICD-10-CM

## 2024-06-19 DIAGNOSIS — R5383 Other fatigue: Secondary | ICD-10-CM | POA: Diagnosis not present

## 2024-06-19 DIAGNOSIS — E559 Vitamin D deficiency, unspecified: Secondary | ICD-10-CM

## 2024-06-19 LAB — VITAMIN B12: Vitamin B-12: 218 pg/mL (ref 211–911)

## 2024-06-19 LAB — T4, FREE: Free T4: 0.77 ng/dL (ref 0.60–1.60)

## 2024-06-19 LAB — VITAMIN D 25 HYDROXY (VIT D DEFICIENCY, FRACTURES): VITD: 8.98 ng/mL — ABNORMAL LOW (ref 30.00–100.00)

## 2024-06-19 LAB — T3, FREE: T3, Free: 3.2 pg/mL (ref 2.3–4.2)

## 2024-06-19 LAB — TSH: TSH: 8.26 u[IU]/mL — ABNORMAL HIGH (ref 0.35–5.50)

## 2024-06-19 NOTE — Patient Instructions (Signed)
 Stop by the lab prior to leaving today. I will notify you of your results once received.   Someone will call you to schedule the ultrasound.   Try Prilosec over the counter.   I will be in touch with the results.   It was a pleasure to see you today!

## 2024-06-19 NOTE — Progress Notes (Signed)
 "  Established Patient Office Visit  Subjective   Patient ID: Cindy Vazquez, female    DOB: [birth year]  Age: 49 y.o. MRN: [id number]  Chief Complaint  Patient presents with   knot in throat    X 3 weeks. Patient feels like it has gotten better and somewhat smaller. Patient does not feel any pain or anything with it. Patient states its at the bottom of her throat. Patient states she feels it most after eating or laying down. Does not hurt to eat, chew or swallow.     HPI  Discussed the use of AI scribe software for clinical note transcription with the patient, who gave verbal consent to proceed.  History of Present Illness Cindy Vazquez is a 49 year old female who presents with a sensation of a lump in her throat for three weeks.  She has been experiencing a sensation of a lump in her throat for the past three weeks, described as a 'knot in the throat' that has improved somewhat but remains bothersome. It is more noticeable after eating and when lying down at night. She feels like something is moving from one side of her throat to the other, but she reports no pain, difficulty swallowing, or sensation of obstruction. No nasal congestion, fever, or significant cough, although she occasionally coughs due to the sensation in her throat. She has not experienced typical symptoms of acid reflux recently, despite having a history of it.  She has a history of thyroid  issues, with previous blood work indicating high thyroid  function. She reports significant fatigue, low energy levels, and a lack of sex drive, which has impacted her marriage. She mentions weight gain, particularly in the abdominal area, but does not find it concerning.   She has stopped taking gabapentin  for restless leg syndrome, as she forgot to take it and noticed improved sleep without it. She has not started hydroxyzine .  She is cautious about taking medications and has not tried any over-the-counter treatments for her  current symptoms. She manages anxiety without medication and feels she maintains it well, although she acknowledges it could be contributing to her symptoms.     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   Past Medical History:  Diagnosis Date   Atypical squamous cell changes of undetermined significance (ASCUS) on cervical cytology with negative high risk human papilloma virus (HPV) test result 09/11/2016   Breast lump 05/22/2012   Depression    Past Surgical History:  Procedure Laterality Date   MAXILLARY CYST EXCISION Left 2006   Allergies[1]       06/19/2024    8:35 AM 01/01/2024    3:29 PM 11/19/2023    2:10 PM  Depression screen PHQ 2/9  Decreased Interest 1 0 1  Down, Depressed, Hopeless 0 0 0  PHQ - 2 Score 1 0 1  Altered sleeping 3 2 2   Tired, decreased energy 2 2 2   Change in appetite 0 0 0  Feeling bad or failure about yourself  0 0 0  Trouble concentrating 0 0 0  Moving slowly or fidgety/restless 0 0 0  Suicidal thoughts 0 0 0  PHQ-9 Score 6 4  5    Difficult doing work/chores Somewhat difficult Somewhat difficult Somewhat difficult     Data saved with a previous flowsheet row definition  06/19/2024    8:35 AM 01/01/2024    3:29 PM 11/19/2023    2:10 PM 03/23/2023    3:53 PM  GAD 7 : Generalized Anxiety Score  Nervous, Anxious, on Edge 1 0  0  0   Control/stop worrying 0 0  0  0   Worry too much - different things 0 0  0  0   Trouble relaxing 0 0  0  0   Restless 0 0  0  0   Easily annoyed or irritable 1 1  1   0   Afraid - awful might happen 0 0  0  0   Total GAD 7 Score 2 1 1  0  Anxiety Difficulty Somewhat difficult Not difficult at all Somewhat difficult      Data saved with a previous flowsheet row definition      ROS    Objective:     BP 114/80    Pulse 78   Temp 98.2 F (36.8 C) (Temporal)   Ht 4' 11 (1.499 m)   Wt 105 lb (47.6 kg)   SpO2 98%   BMI 21.21 kg/m  BP Readings from Last 3 Encounters:  06/19/24 114/80  01/01/24 112/80  11/19/23 118/66   Wt Readings from Last 3 Encounters:  06/19/24 105 lb (47.6 kg)  01/01/24 108 lb (49 kg)  11/19/23 109 lb (49.4 kg)      Physical Exam   No results found for any visits on 06/19/24.     The ASCVD Risk score (Arnett DK, et al., 2019) failed to calculate for the following reasons:   Cannot find a previous HDL lab   Cannot find a previous total cholesterol lab   * - Cholesterol units were assumed    Assessment & Plan:  Other fatigue -     TSH -     T4, free -     T3, free -     VITAMIN D 25 Hydroxy (Vit-D Deficiency, Fractures) -     Vitamin B12  Abnormal TSH -     TSH -     T4, free -     T3, free  Knot in throat -     US  SOFT TISSUE HEAD & NECK (NON-THYROID ); Future    Assessment and Plan Assessment & Plan Evaluation of neck mass and abnormal thyroid  function Sensation in throat possibly due to acid reflux or thyroid  abnormalities. Previous thyroid  tests showed high levels, follow-up needed.  - Ordered TSH, T4, and T3 tests. - Ordered neck ultrasound. - Advised omeprazole for two weeks if symptoms worsen.   Fatigue and low libido, possible endocrine etiology Fatigue and low libido possibly related to thyroid  dysfunction or anxiety. - Ordered TSH, T4, T3, vitamin D, and B12 tests.  Restless legs syndrome Improvement reported without gabapentin .  Anxiety Anxiety may contribute to symptoms. Prefers non-pharmacological management. - Continue current coping strategies.   Return if symptoms worsen or fail to improve.    Carrol Aurora, NP     [1]  Allergies Allergen Reactions   Ciprofloxacin Swelling    facial   "

## 2024-06-20 MED ORDER — VITAMIN D (ERGOCALCIFEROL) 1.25 MG (50000 UNIT) PO CAPS: 50000.0000 [IU] | ORAL_CAPSULE | ORAL | 0 refills | Status: AC

## 2024-06-20 MED ORDER — LEVOTHYROXINE SODIUM 25 MCG PO TABS: 25.0000 ug | ORAL_TABLET | Freq: Every day | ORAL | 0 refills | Status: AC

## 2024-06-26 ENCOUNTER — Other Ambulatory Visit: Payer: Self-pay | Admitting: General Practice

## 2024-06-26 ENCOUNTER — Ambulatory Visit: Admission: RE | Admit: 2024-06-26 | Discharge: 2024-06-26 | Attending: General Practice | Admitting: General Practice

## 2024-06-26 DIAGNOSIS — G2581 Restless legs syndrome: Secondary | ICD-10-CM

## 2024-06-26 DIAGNOSIS — R221 Localized swelling, mass and lump, neck: Secondary | ICD-10-CM

## 2024-06-26 DIAGNOSIS — R5383 Other fatigue: Secondary | ICD-10-CM

## 2024-06-26 DIAGNOSIS — F419 Anxiety disorder, unspecified: Secondary | ICD-10-CM

## 2024-06-26 DIAGNOSIS — R7989 Other specified abnormal findings of blood chemistry: Secondary | ICD-10-CM

## 2024-10-14 ENCOUNTER — Ambulatory Visit: Admitting: Family Medicine
